# Patient Record
Sex: Female | Born: 1955 | Race: White | Hispanic: No | Marital: Single | Smoking: Never smoker
Health system: Southern US, Community
[De-identification: ages and names within clinical notes are randomized; demographics above are authoritative.]

## PROBLEM LIST (undated history)

## (undated) DIAGNOSIS — K219 Gastro-esophageal reflux disease without esophagitis: Secondary | ICD-10-CM

## (undated) DIAGNOSIS — Z87442 Personal history of urinary calculi: Secondary | ICD-10-CM

## (undated) DIAGNOSIS — M1990 Unspecified osteoarthritis, unspecified site: Secondary | ICD-10-CM

## (undated) DIAGNOSIS — E785 Hyperlipidemia, unspecified: Secondary | ICD-10-CM

## (undated) DIAGNOSIS — T8859XA Other complications of anesthesia, initial encounter: Secondary | ICD-10-CM

## (undated) DIAGNOSIS — F32A Depression, unspecified: Secondary | ICD-10-CM

## (undated) DIAGNOSIS — I639 Cerebral infarction, unspecified: Secondary | ICD-10-CM

## (undated) DIAGNOSIS — I10 Essential (primary) hypertension: Secondary | ICD-10-CM

## (undated) DIAGNOSIS — H269 Unspecified cataract: Secondary | ICD-10-CM

## (undated) HISTORY — PX: DILATION AND CURETTAGE OF UTERUS: SHX78

## (undated) HISTORY — PX: COLONOSCOPY: SHX174

## (undated) HISTORY — DX: Hyperlipidemia, unspecified: E78.5

## (undated) HISTORY — DX: Depression, unspecified: F32.A

## (undated) HISTORY — PX: ESOPHAGOGASTRODUODENOSCOPY: SHX1529

## (undated) HISTORY — PX: APPENDECTOMY: SHX54

## (undated) HISTORY — PX: ABDOMINAL HYSTERECTOMY: SHX81

## (undated) HISTORY — DX: Essential (primary) hypertension: I10

## (undated) HISTORY — DX: Cerebral infarction, unspecified: I63.9

## (undated) HISTORY — PX: JOINT REPLACEMENT: SHX530

## (undated) HISTORY — PX: CHOLECYSTECTOMY: SHX55

## (undated) HISTORY — PX: HIP SURGERY: SHX245

## (undated) HISTORY — DX: Unspecified cataract: H26.9

## (undated) HISTORY — PX: TONSILLECTOMY: SUR1361

## (undated) HISTORY — DX: Gastro-esophageal reflux disease without esophagitis: K21.9

---

## 2014-09-25 DIAGNOSIS — I639 Cerebral infarction, unspecified: Secondary | ICD-10-CM

## 2014-09-25 HISTORY — DX: Cerebral infarction, unspecified: I63.9

## 2021-01-22 ENCOUNTER — Emergency Department
Admission: EM | Admit: 2021-01-22 | Discharge: 2021-01-22 | Disposition: A | Discharge status: Home / Self Care | Payer: Medicare Other | Attending: Emergency Medicine | Admitting: Emergency Medicine

## 2021-01-22 ENCOUNTER — Other Ambulatory Visit: Payer: Self-pay

## 2021-01-22 ENCOUNTER — Emergency Department: Payer: Medicare Other

## 2021-01-22 ENCOUNTER — Encounter: Payer: Self-pay | Admitting: Physician Assistant

## 2021-01-22 DIAGNOSIS — S76012A Strain of muscle, fascia and tendon of left hip, initial encounter: Secondary | ICD-10-CM | POA: Diagnosis not present

## 2021-01-22 DIAGNOSIS — W010XXA Fall on same level from slipping, tripping and stumbling without subsequent striking against object, initial encounter: Secondary | ICD-10-CM | POA: Diagnosis not present

## 2021-01-22 DIAGNOSIS — R202 Paresthesia of skin: Secondary | ICD-10-CM | POA: Diagnosis not present

## 2021-01-22 DIAGNOSIS — S79912A Unspecified injury of left hip, initial encounter: Secondary | ICD-10-CM | POA: Diagnosis present

## 2021-01-22 DIAGNOSIS — W19XXXA Unspecified fall, initial encounter: Secondary | ICD-10-CM

## 2021-01-22 DIAGNOSIS — Z7982 Long term (current) use of aspirin: Secondary | ICD-10-CM | POA: Insufficient documentation

## 2021-01-22 DIAGNOSIS — Z96642 Presence of left artificial hip joint: Secondary | ICD-10-CM | POA: Insufficient documentation

## 2021-01-22 IMAGING — CR DG HIP (WITH OR WITHOUT PELVIS) 2-3V*L*
1 series · 3 of 3 positions shown · non-contrast
Comparison: None.

CLINICAL DATA: Left hip pain after falling 5 days ago.

EXAM:
DG HIP (WITH OR WITHOUT PELVIS) 2-3V LEFT

[Series 1: dg hip unilat w or w/o pelvis 2-3 views  · non-contrast · 0.14mm/px · 3 of 3 slices shown]
[im 1/3]
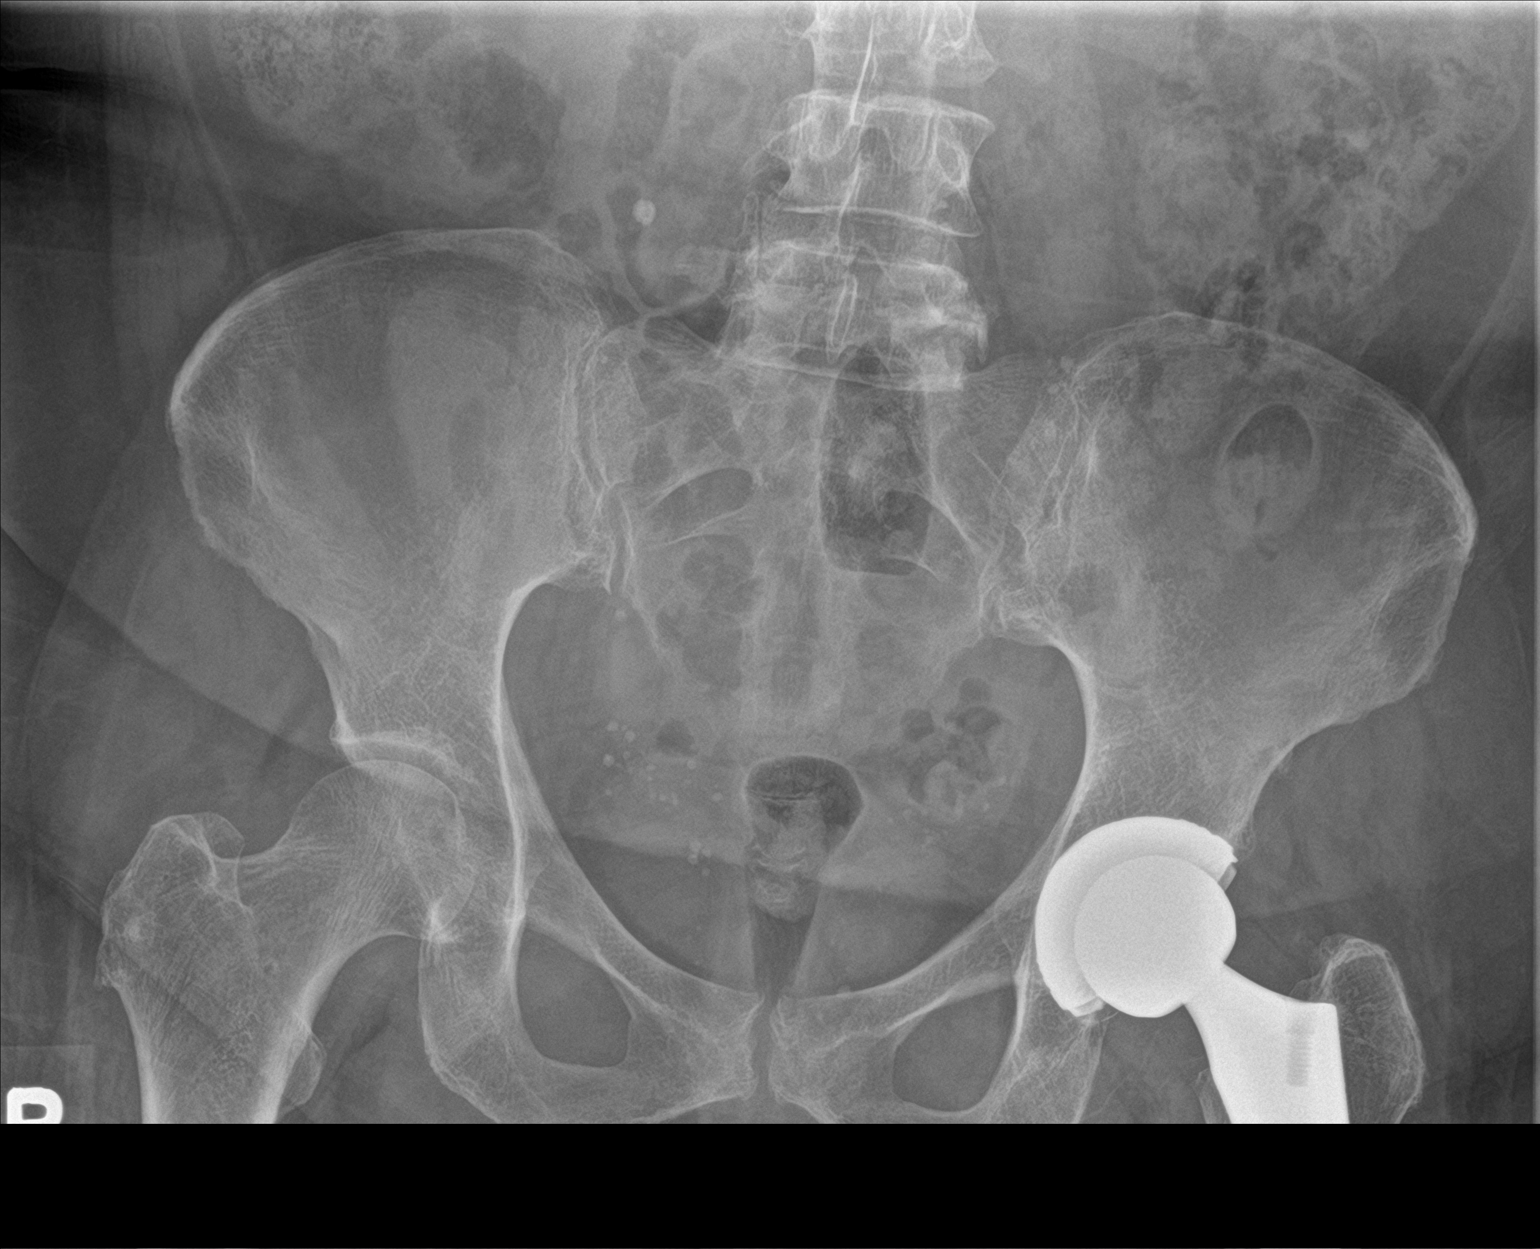
[im 2/3]
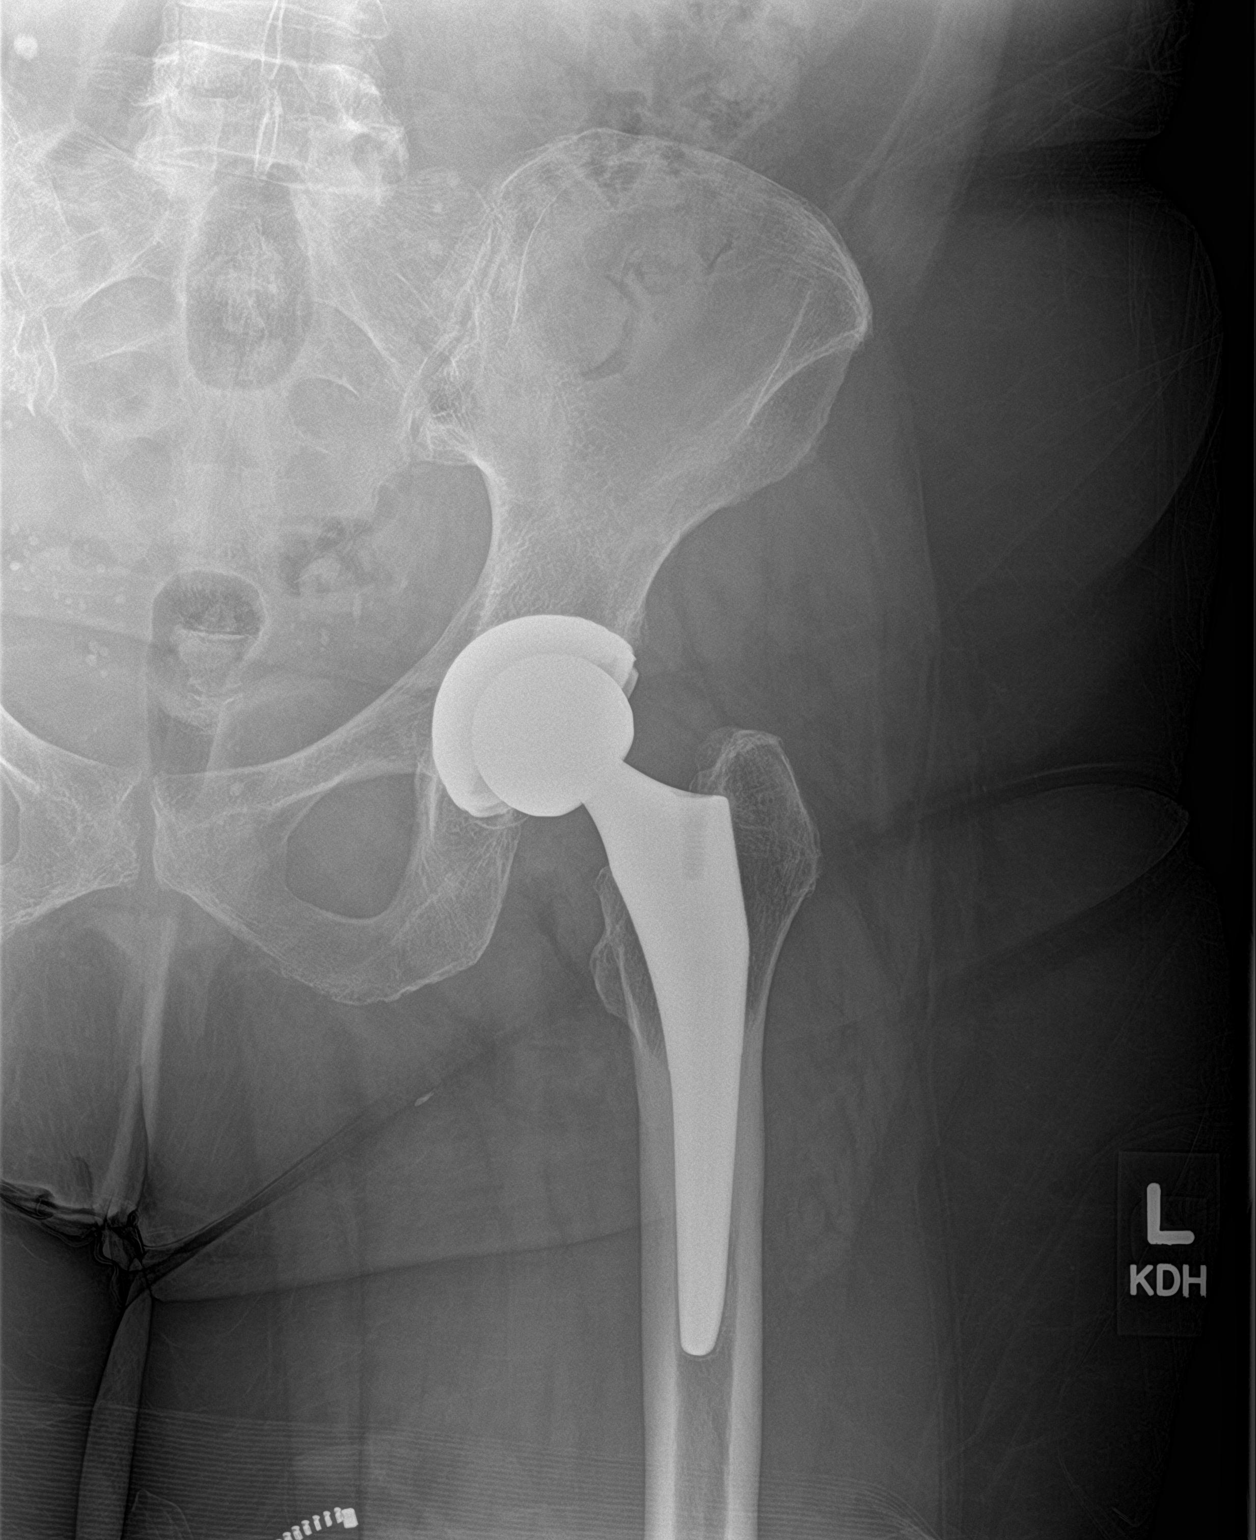
[im 3/3]
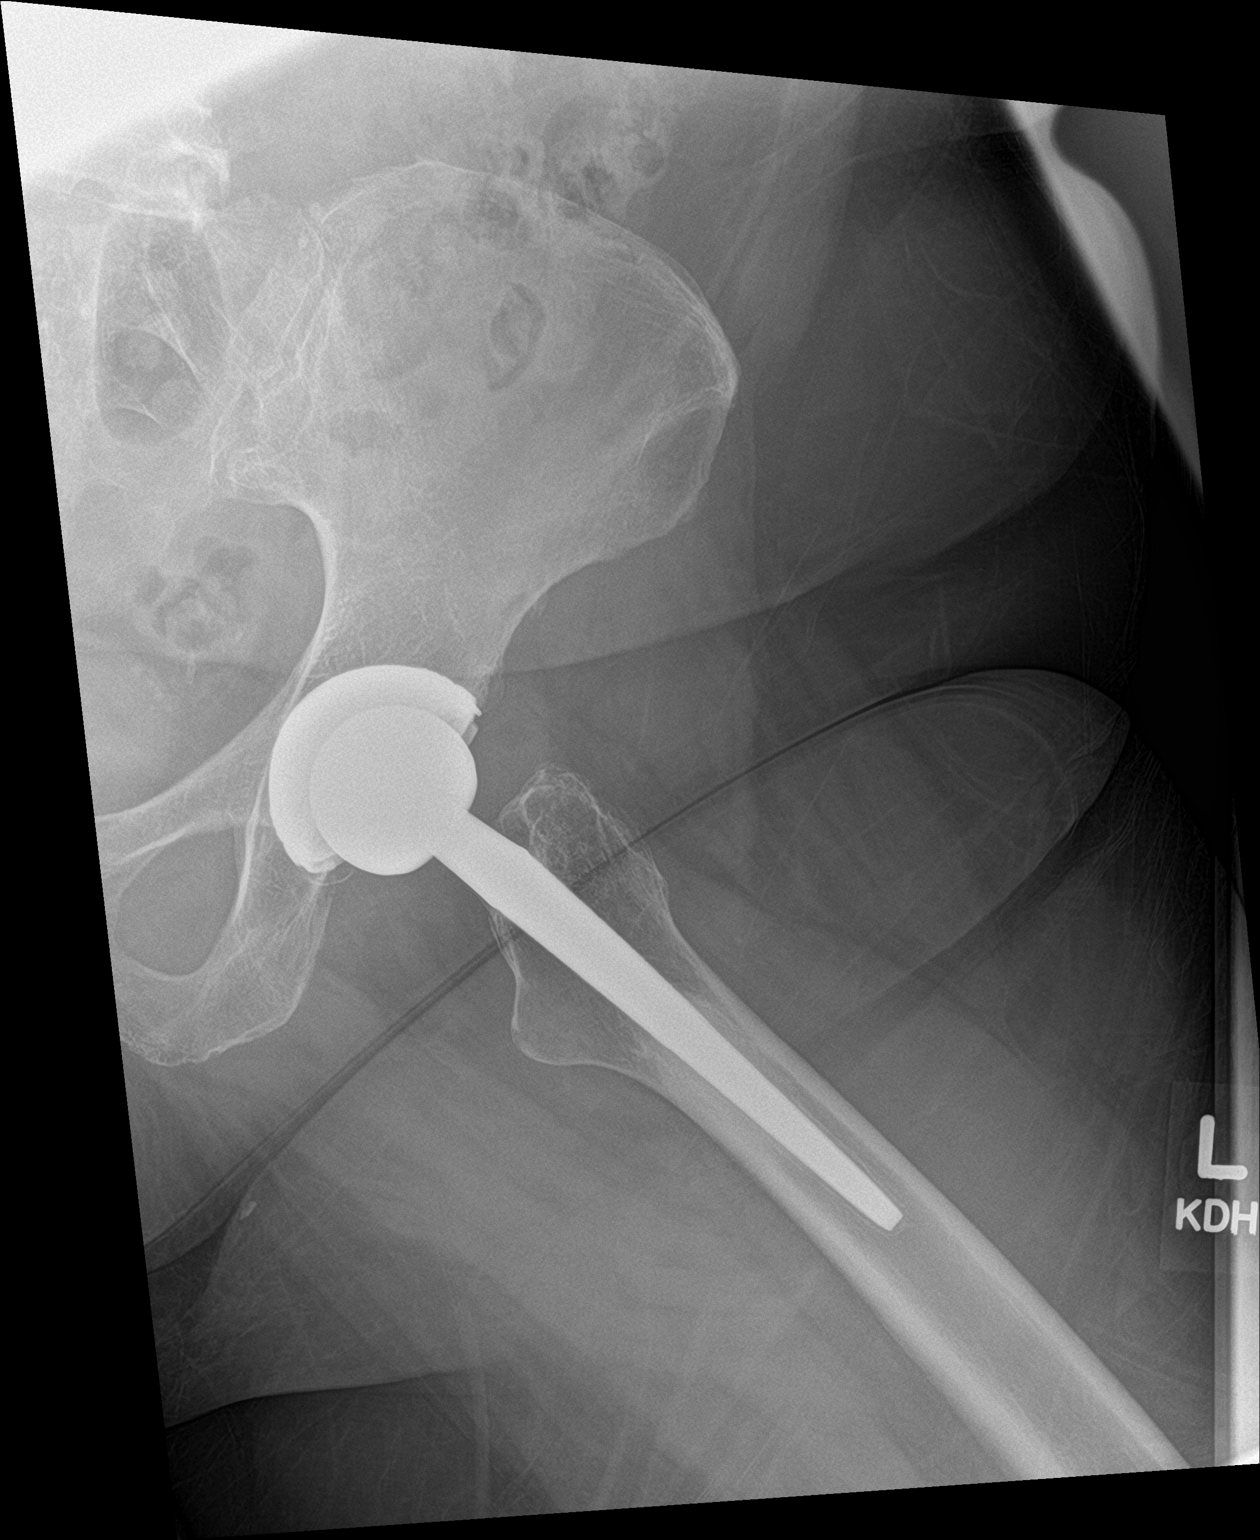

[3 of 3 positions shown; findings below may reference images not displayed]

FINDINGS: Pelvic bony ring is intact. There is a left hip arthroplasty is
located. Negative for a periprosthetic fracture. Multiple small
calcifications in the pelvis. There is a prominent calcification in
the right lower abdomen.
IMPRESSION: No acute bone abnormality to the pelvis or left hip.

## 2021-01-22 MED ORDER — KETOROLAC TROMETHAMINE 10 MG PO TABS: 10.0000 mg | ORAL_TABLET | Freq: Three times a day (TID) | ORAL | 0 refills | Status: DC

## 2021-01-22 MED ORDER — CYCLOBENZAPRINE HCL 5 MG PO TABS: 5.0000 mg | ORAL_TABLET | Freq: Three times a day (TID) | ORAL | 0 refills | Status: DC | PRN

## 2021-01-22 MED ORDER — CYCLOBENZAPRINE HCL 10 MG PO TABS
5.0000 mg | ORAL_TABLET | Freq: Once | ORAL | Status: AC
  Administered 2021-01-22: 5 mg via ORAL
  Filled 2021-01-22: qty 1

## 2021-01-22 MED ORDER — KETOROLAC TROMETHAMINE 10 MG PO TABS
10.0000 mg | ORAL_TABLET | Freq: Once | ORAL | Status: AC
  Administered 2021-01-22: 10 mg via ORAL
  Filled 2021-01-22: qty 1

## 2021-01-22 NOTE — ED Triage Notes (Signed)
Pt to ED POV for left hip pain after fall 5 days ago. States hx of surgery to left hip and has chronic numbness which causes frequent falls. Denies hitting head. States now hard to walk on left hip d/t pain.

## 2021-01-22 NOTE — Discharge Instructions (Addendum)
Your exam and XR are normal and reassuring following your fall. Take the prescription meds as directed. Follow-up with one of the local providers for continued care. Return as needed.

## 2021-01-23 NOTE — ED Provider Notes (Signed)
[facility] Emergency Department Provider Note ____________________________________________  Time seen: 1535  I have reviewed the triage vital signs and the nursing notes.  HISTORY  Chief Complaint  Hip Pain   HPI Tammy Stephens is a 65 y.o. female below medical history, and a remote history of a left hip replacement, presents for evaluation after a fall.  The patient apparently fell 5 days ago, but has had persistent left-sided hip pain since that time.  She does not report ambulating with a walker or crutch, despite reporting chronic left leg paresthesias and frequent falls.  Patient describes slipping and falling onto her right hip, but experiencing left hip pain and discomfort.  She denies any head injury or LOC.   History reviewed. No pertinent past medical history.  There are no problems to display for this patient.   History reviewed. No pertinent surgical history.  Prior to Admission medications   Medication Sig Start Date End Date Taking? Authorizing Provider  aspirin EC 81 MG tablet Take 81 mg by mouth daily. Swallow whole.   Yes [provider]  atorvastatin (LIPITOR) 20 MG tablet Take 20 mg by mouth daily.   Yes [provider]  b complex vitamins capsule Take 1 capsule by mouth daily.   Yes [provider]  cholecalciferol (VITAMIN D3) 25 MCG (1000 UNIT) tablet Take 1,000 Units by mouth daily.   Yes [provider]  cyclobenzaprine (FLEXERIL) 5 MG tablet Take 1 tablet (5 mg total) by mouth 3 (three) times daily as needed. 01/22/21  Yes Alastor Kneale, Charlesetta Ivory, PA-C  DULoxetine (CYMBALTA) 60 MG capsule Take 60 mg by mouth daily.   Yes [provider]  gabapentin (NEURONTIN) 100 MG capsule Take 100 mg by mouth 3 (three) times daily.   Yes [provider]  ketorolac (TORADOL) 10 MG tablet Take 1 tablet (10 mg total) by mouth every 8 (eight) hours. 01/22/21  Yes Lj Miyamoto, Charlesetta Ivory, PA-C  losartan  (COZAAR) 50 MG tablet Take 50 mg by mouth daily.   Yes [provider]  multivitamin-iron-minerals-folic acid (CENTRUM) chewable tablet Chew 1 tablet by mouth daily.   Yes [provider]  nortriptyline (PAMELOR) 25 MG capsule Take 25 mg by mouth at bedtime.   Yes [provider]  omeprazole (PRILOSEC) 20 MG capsule Take 20 mg by mouth daily.   Yes [provider]    Allergies Patient has no known allergies.  History reviewed. No pertinent family history.  Social History    Review of Systems  Constitutional: Negative for fever. Eyes: Negative for visual changes. ENT: Negative for sore throat. Cardiovascular: Negative for chest pain. Respiratory: Negative for shortness of breath. Gastrointestinal: Negative for abdominal pain, vomiting and diarrhea. Genitourinary: Negative for dysuria. Musculoskeletal: Negative for back pain.  Pain as above. Skin: Negative for rash. Neurological: Negative for headaches, focal weakness or numbness. ____________________________________________  PHYSICAL EXAM:  VITAL SIGNS: ED Triage Vitals  Enc Vitals Group     BP 01/22/21 1329 130/76     Pulse Rate 01/22/21 1329 (!) 110     Resp 01/22/21 1329 18     Temp 01/22/21 1329 99.6 F (37.6 C)     Temp Source 01/22/21 1329 Oral     SpO2 01/22/21 1329 99 %     Weight 01/22/21 1330 200 lb (90.7 kg)     Height 01/22/21 1330 5\' 2"  (1.575 m)     Head Circumference --      Peak Flow --  Pain Score 01/22/21 1330 8     Pain Loc --      Pain Edu? --      Excl. in GC? --     Constitutional: Alert and oriented. Well appearing and in no distress. Head: Normocephalic and atraumatic. Eyes: Conjunctivae are normal.  Neck: Supple.  Normal range of motion. Cardiovascular: Normal rate, regular rhythm. Normal distal pulses. Respiratory: Normal respiratory effort. No wheezes/rales/rhonchi. Gastrointestinal: Soft and nontender. No distention. Musculoskeletal: Left hip  with normal flexion and extension range on exam.  Mildly tender to palpation to the left gluteal and trochanteric region.  Nontender with normal range of motion in all extremities.  Neurologic: Cranial nerves II to XII grossly intact.  Normal LE DTRs bilaterally.  Negative seated straight leg raise.  Normal gait without ataxia. Normal speech and language. No gross focal neurologic deficits are appreciated. Skin:  Skin is warm, dry and intact. No rash noted. Psychiatric: Mood and affect are normal. Patient exhibits appropriate insight and judgment. ____________________________________________   RADIOLOGY  DG Left Hip w/ Pelvis  IMPRESSION: No acute bone abnormality to the pelvis or left hip ____________________________________________  PROCEDURES  Toradol 30 mg IM Flexeril 5 mg PO  Procedures ____________________________________________   INITIAL IMPRESSION / ASSESSMENT AND PLAN / ED COURSE  As part of my medical decision making, I reviewed the following data within the electronic MEDICAL RECORD NUMBER Notes from prior ED visits     Patient ED evaluation of ongoing left hip pain following mechanical fall.  Patient was evaluated for symptoms with an x-ray of the hip and pelvis did not reveal any acute fracture or dislocation.  Clinically she is stable without any acute or muscle deficit or other red flags on exam.  She was discharged after ED administration of muscle relaxant and anti-inflammatory reporting improved symptoms.  She will be discharged with prescriptions for the same, and encouraged to follow-up with primary provider.  Return cautions have been reviewed.  Palin Tristan was evaluated in Emergency Department on 01/23/2021 for the symptoms described in the history of present illness. She was evaluated in the context of the global COVID-19 pandemic, which necessitated consideration that the patient might be at risk for infection with the SARS-CoV-2 virus that causes COVID-19.  Institutional protocols and algorithms that pertain to the evaluation of patients at risk for COVID-19 are in a state of rapid change based on information released by regulatory bodies including the CDC and federal and state organizations. These policies and algorithms were followed during the patient's care in the ED. ____________________________________________  FINAL CLINICAL IMPRESSION(S) / ED DIAGNOSES  Final diagnoses:  Fall, initial encounter  Hip strain, left, initial encounter      Lissa Hoard, PA-C 01/23/21 Joya San, MD 01/27/21 [phone]

## 2021-10-14 ENCOUNTER — Other Ambulatory Visit: Payer: Self-pay | Admitting: Orthopedic Surgery

## 2021-10-14 DIAGNOSIS — M545 Low back pain, unspecified: Secondary | ICD-10-CM

## 2021-10-19 ENCOUNTER — Ambulatory Visit
Admission: RE | Admit: 2021-10-19 | Discharge: 2021-10-19 | Disposition: A | Discharge status: Home / Self Care | Payer: Medicare Other | Source: Ambulatory Visit | Attending: Orthopedic Surgery | Admitting: Orthopedic Surgery

## 2021-10-19 ENCOUNTER — Other Ambulatory Visit: Payer: Self-pay

## 2021-10-19 DIAGNOSIS — M545 Low back pain, unspecified: Secondary | ICD-10-CM | POA: Diagnosis present

## 2021-10-19 IMAGING — MR MR LUMBAR SPINE W/O CM
5 series · 31 of 48 positions shown · non-contrast
Comparison: None.

CLINICAL DATA: Low back pain radiating into right leg and knee

EXAM:
MRI LUMBAR SPINE WITHOUT CONTRAST
TECHNIQUE: Multiplanar, multisequence MR imaging of the lumbar spine was
performed. No intravenous contrast was administered.

[Series 5: T2 · sagittal · 4.0mm · 0.81mm/px · 6 of 17 slices shown (1 of 2)]
[im 1/17]
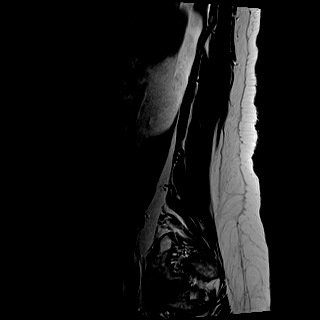
[im 4/17]
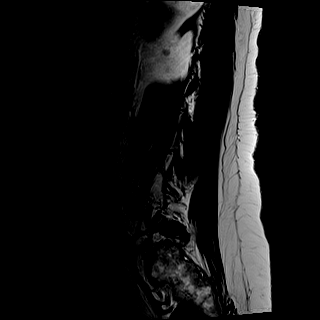
[im 7/17]
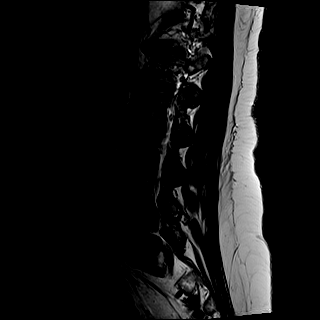
[im 10/17]
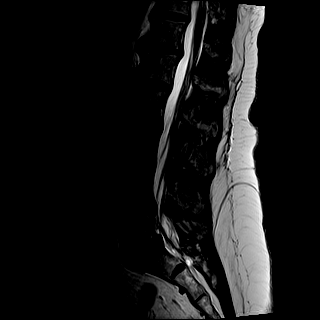
[im 13/17]
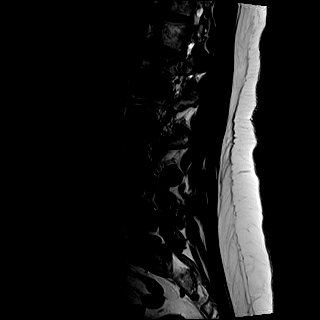
[im 17/17]
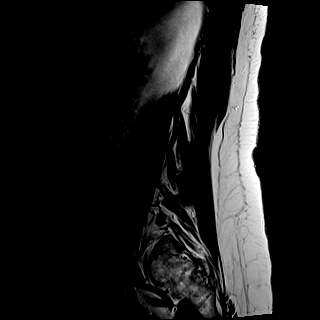

[Series 6: T1 · sagittal · 4.0mm · 0.81mm/px · 7 of 17 slices shown (1 of 2)]
[im 1/17]
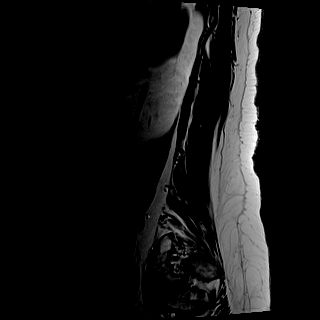
[im 3/17]
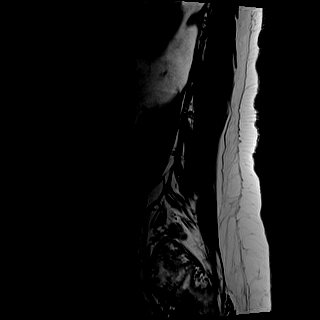
[im 6/17]
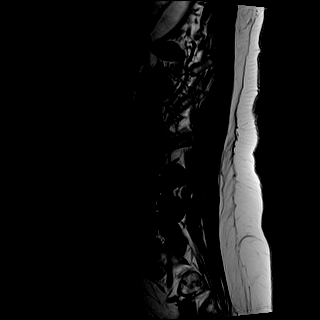
[im 9/17]
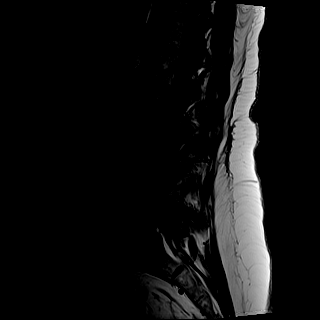
[im 11/17]
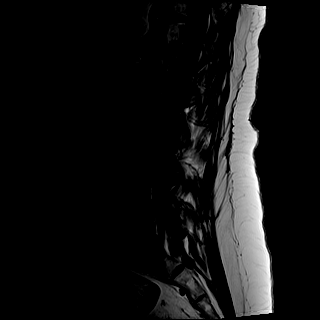
[im 14/17]
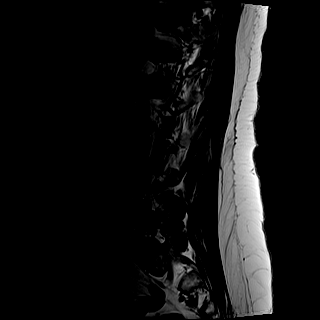
[im 17/17]
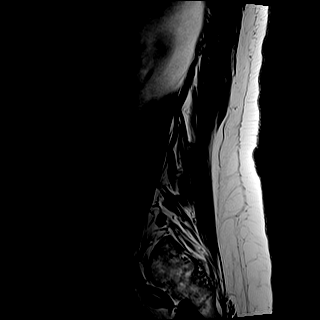

[Series 7: STIR · sagittal · 4.0mm · 0.41mm/px · 2 of 17 slices shown]
[im 1/17]
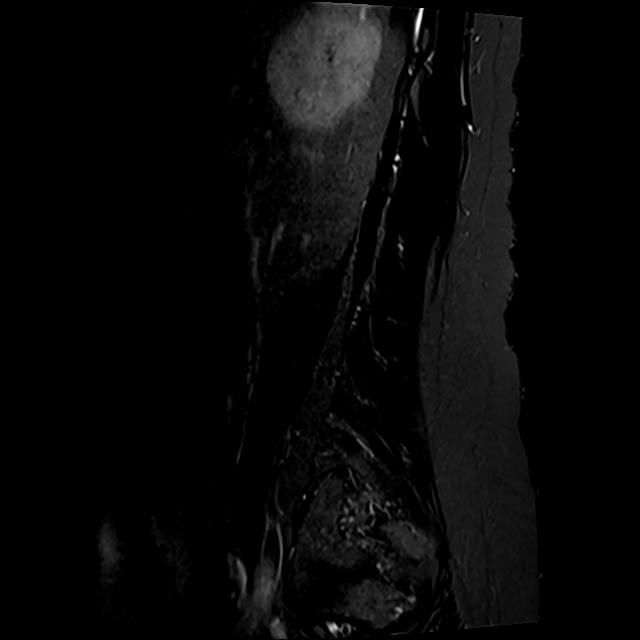
[im 3/17]
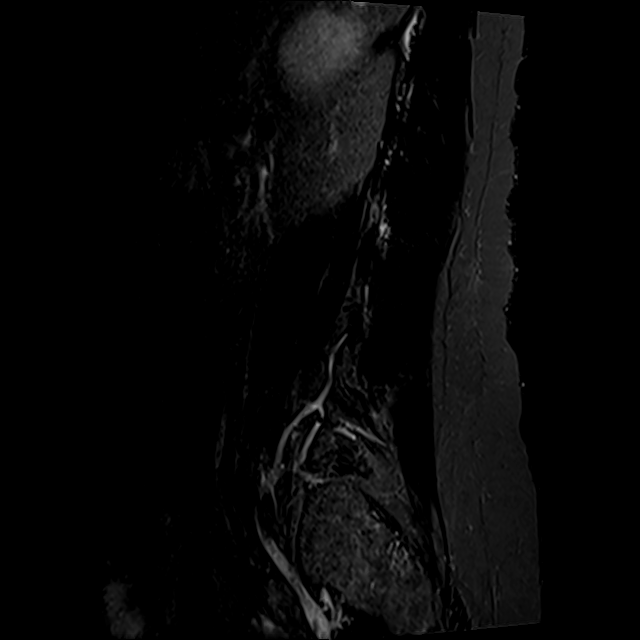

[Series 8: T2 · axial · 4.0mm · 0.78mm/px · z∈[-123,+91]mm · 8 of 36 slices shown (2 of 2)]
[im 1/36]
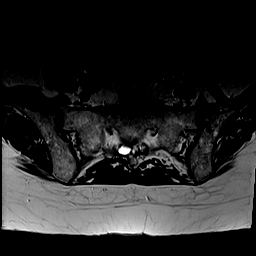
[im 6/36]
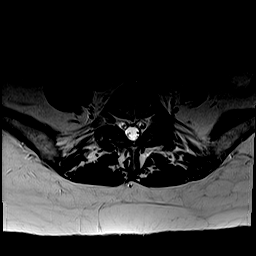
[im 11/36]
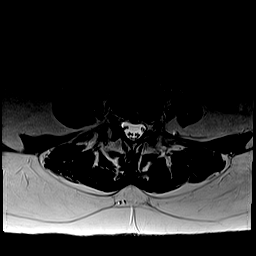
[im 17/36]
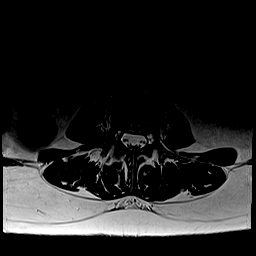
[im 19/36]
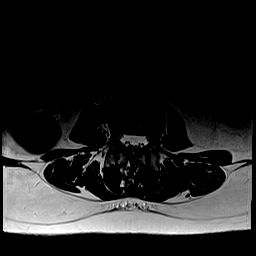
[im 25/36]
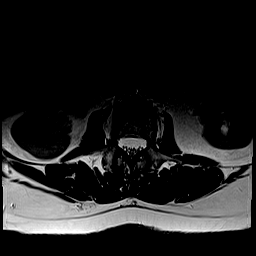
[im 30/36]
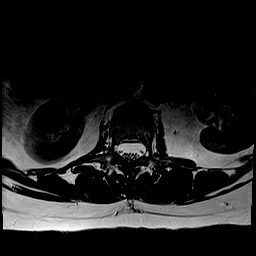
[im 36/36]
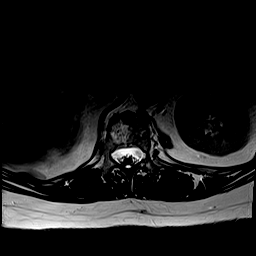

[Series 9: T1 · axial · 4.0mm · 0.39mm/px · z∈[-123,+91]mm · 8 of 36 slices shown (2 of 2)]
[im 1/36]
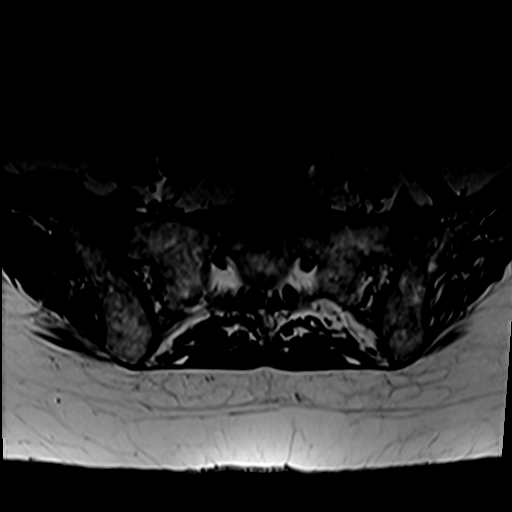
[im 6/36]
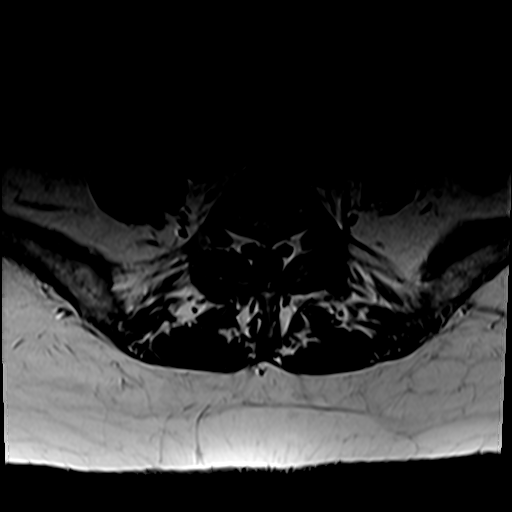
[im 11/36]
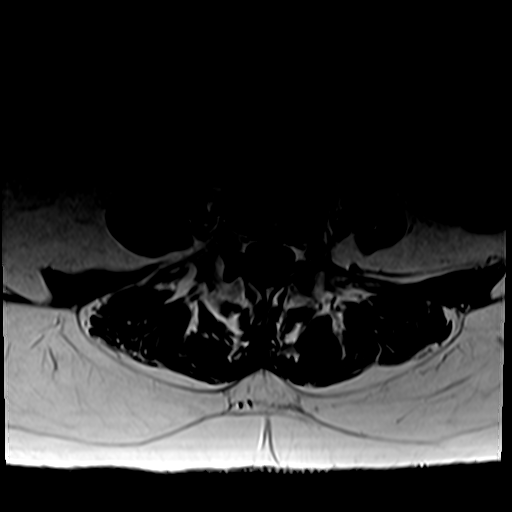
[im 17/36]
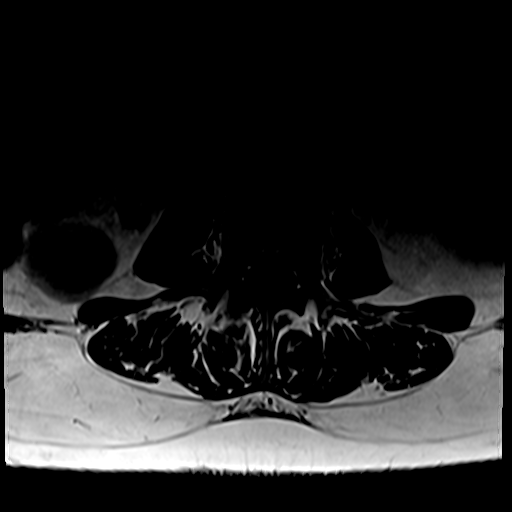
[im 19/36]
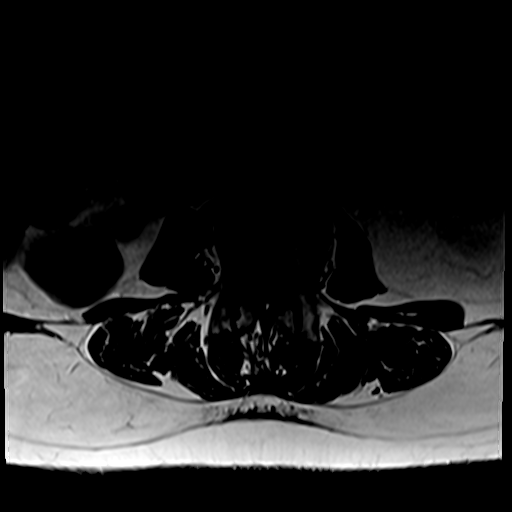
[im 25/36]
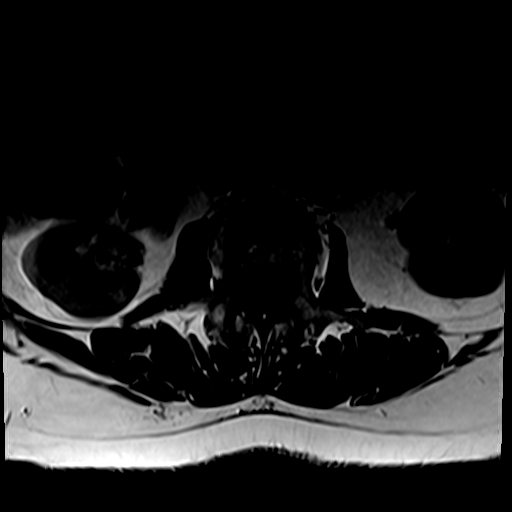
[im 30/36]
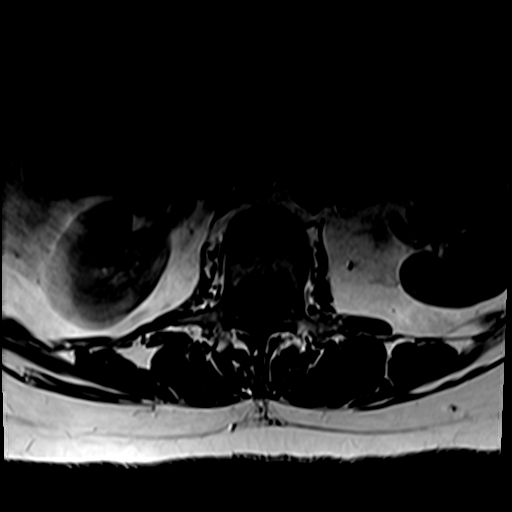
[im 36/36]
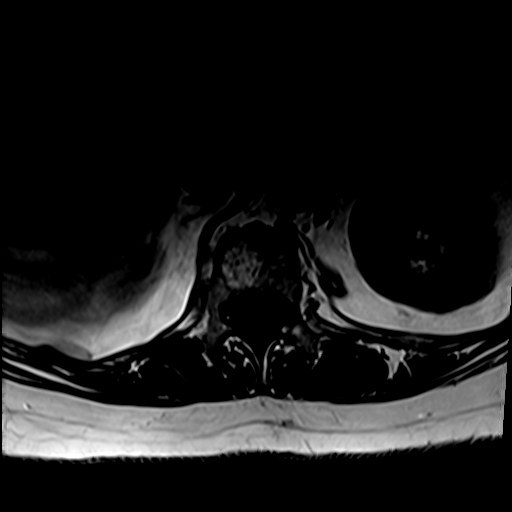

[31 of 48 positions shown; findings below may reference images not displayed]

FINDINGS: Segmentation:  Standard.

Alignment:  Levocurvature.  Otherwise physiologic.

Vertebrae: No acute fracture or suspicious osseous lesion.
Congenitally short pedicles, which narrow the AP diameter of the
spinal canal.

Conus medullaris and cauda equina: Conus extends to the L1 level.
Conus and cauda equina appear normal.

Paraspinal and other soft tissues: Left renal cysts. Otherwise
negative.

Disc levels:

T12-L1: Disc height loss with mild disc bulge with superimposed
right foraminal protrusion and left extreme lateral protrusion. No
spinal canal stenosis or neural foraminal narrowing.

L1-L2: Mild disc bulge. No spinal canal stenosis or neural foraminal
narrowing.

L2-L3: Mild disc bulge. Mild facet arthropathy. No spinal canal
stenosis or neural foraminal narrowing.

L3-L4: Mild disc bulge. Mild facet arthropathy. No spinal canal
stenosis or neural foraminal narrowing.

L4-L5: Moderate disc bulge with central protrusion. Moderate to
severe facet arthropathy. No spinal canal stenosis or neural
foraminal narrowing.

L5-S1: Minimal disc bulge. Severe left and moderate right facet
arthropathy. No spinal canal stenosis or neural foraminal narrowing.
IMPRESSION: 1. No spinal canal stenosis or neural foraminal narrowing.
2. Multilevel facet arthropathy, worst on the left at L5-S1, which
can cause back pain.
3. Levocurvature of the lumbar spine.

## 2021-11-15 ENCOUNTER — Other Ambulatory Visit: Payer: Self-pay | Admitting: Physical Medicine & Rehabilitation

## 2021-11-15 DIAGNOSIS — N39498 Other specified urinary incontinence: Secondary | ICD-10-CM

## 2021-11-20 ENCOUNTER — Other Ambulatory Visit: Payer: Self-pay

## 2021-11-20 ENCOUNTER — Ambulatory Visit
Admission: RE | Admit: 2021-11-20 | Discharge: 2021-11-20 | Disposition: A | Discharge status: Home / Self Care | Payer: Medicare Other | Source: Ambulatory Visit | Attending: Physical Medicine & Rehabilitation | Admitting: Physical Medicine & Rehabilitation

## 2021-11-20 DIAGNOSIS — N39498 Other specified urinary incontinence: Secondary | ICD-10-CM | POA: Diagnosis present

## 2021-11-20 IMAGING — MR MR THORACIC SPINE W/O CM
6 series · 30 of 48 positions shown · non-contrast
Comparison: None.

CLINICAL DATA: Low back with right leg pain, and notes incontinence
since [DATE].

EXAM:
MRI THORACIC SPINE WITHOUT CONTRAST
TECHNIQUE: Multiplanar, multisequence MR imaging of the thoracic spine was
performed. No intravenous contrast was administered.

[Series 18: T1 · sagittal · 6.0mm · 1.41mm/px · 2 of 9 slices shown (1 of 2)]
[im 1/9]
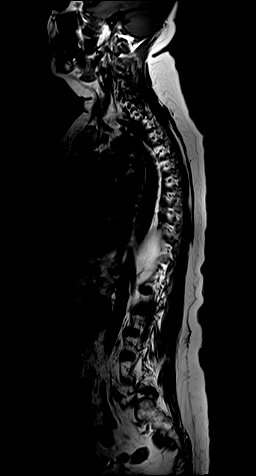
[im 9/9]
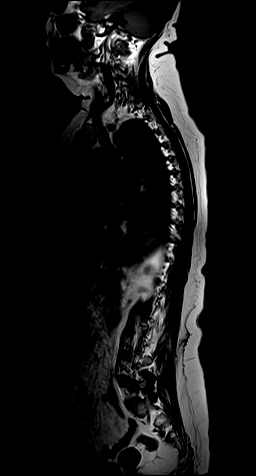

[Series 19: T2 · sagittal · 3.0mm · 1.06mm/px · 6 of 17 slices shown (1 of 2)]
[im 1/17]
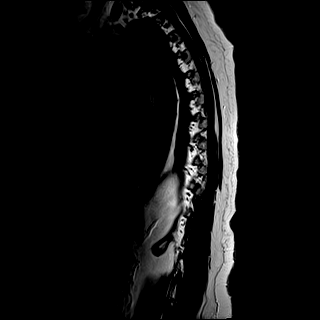
[im 4/17]
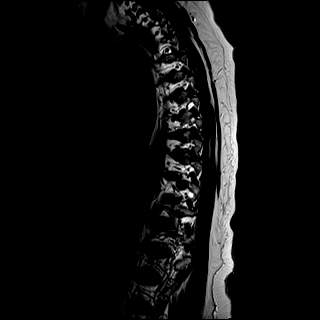
[im 7/17]
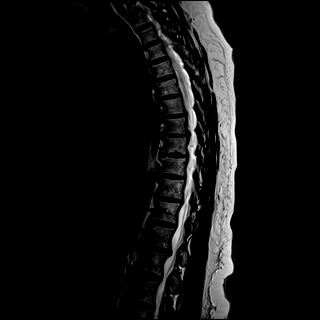
[im 10/17]
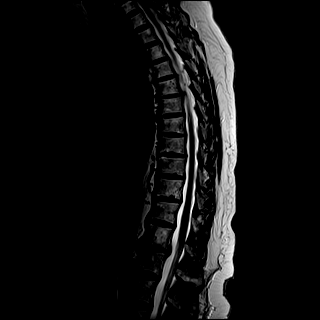
[im 13/17]
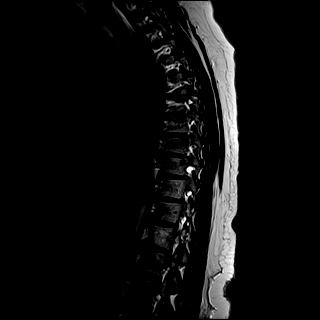
[im 17/17]
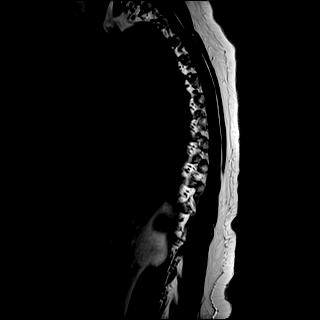

[Series 20: T1 · sagittal · 3.0mm · 1.06mm/px · 6 of 17 slices shown (2 of 2)]
[im 1/17]
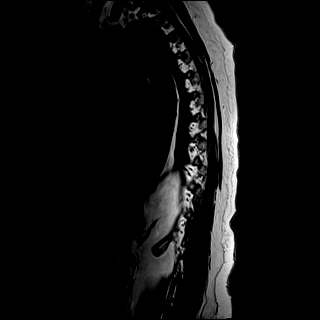
[im 4/17]
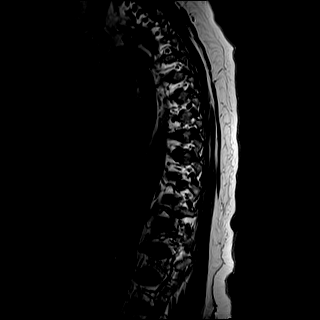
[im 7/17]
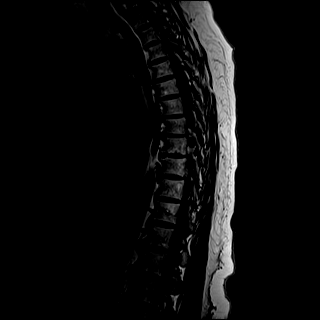
[im 10/17]
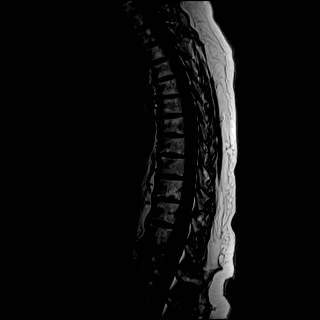
[im 13/17]
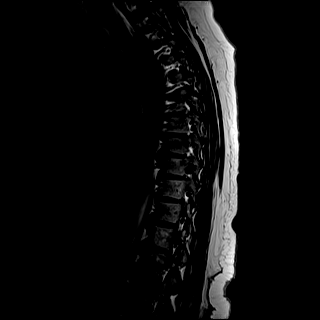
[im 17/17]
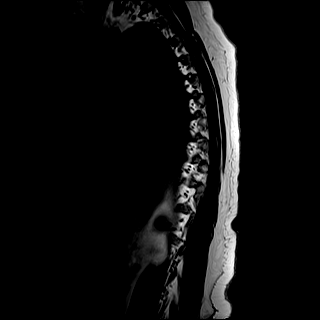

[Series 21: STIR · sagittal · 3.0mm · 0.53mm/px · 6 of 17 slices shown]
[im 1/17]
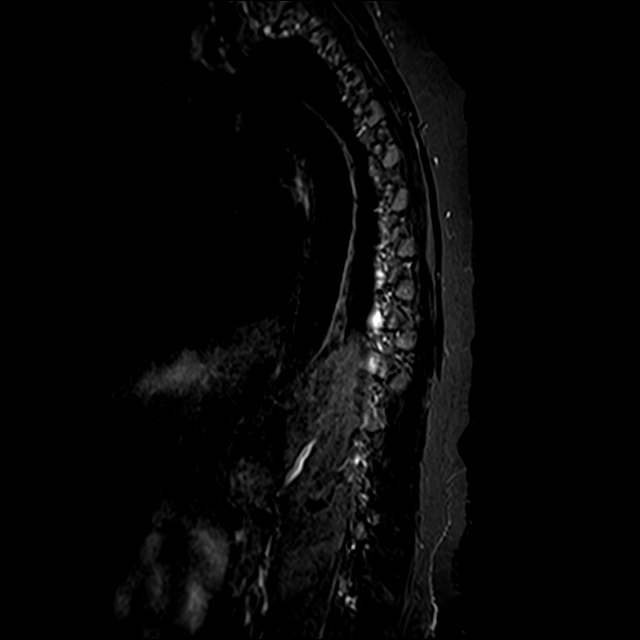
[im 4/17]
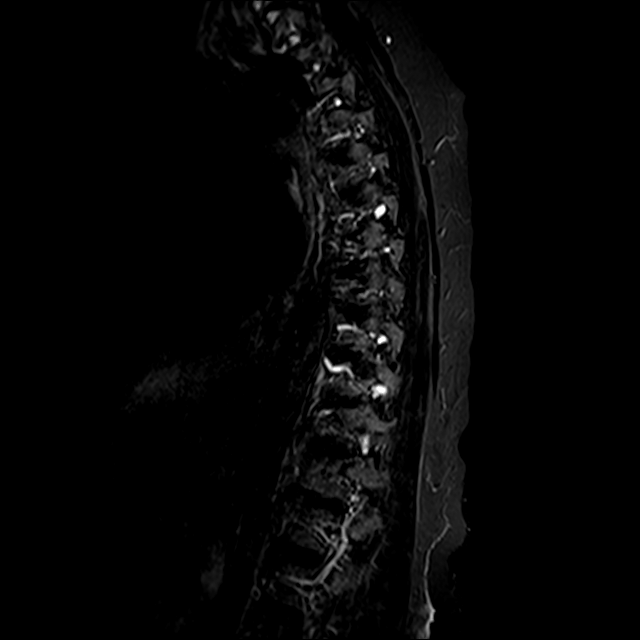
[im 7/17]
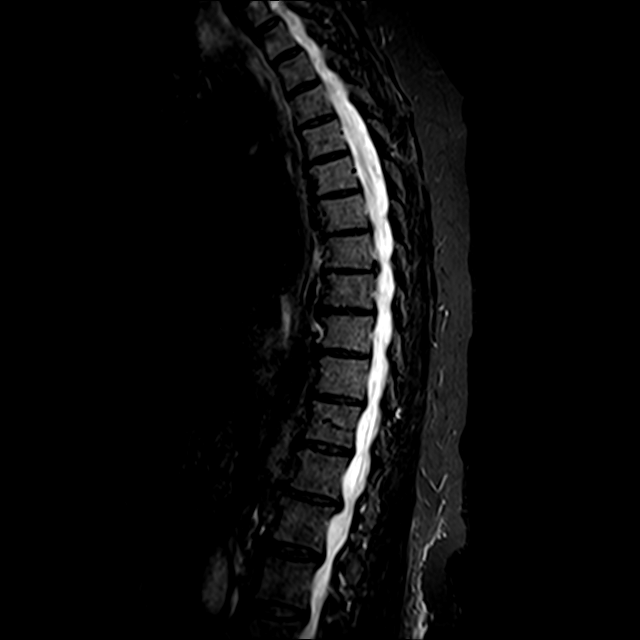
[im 10/17]
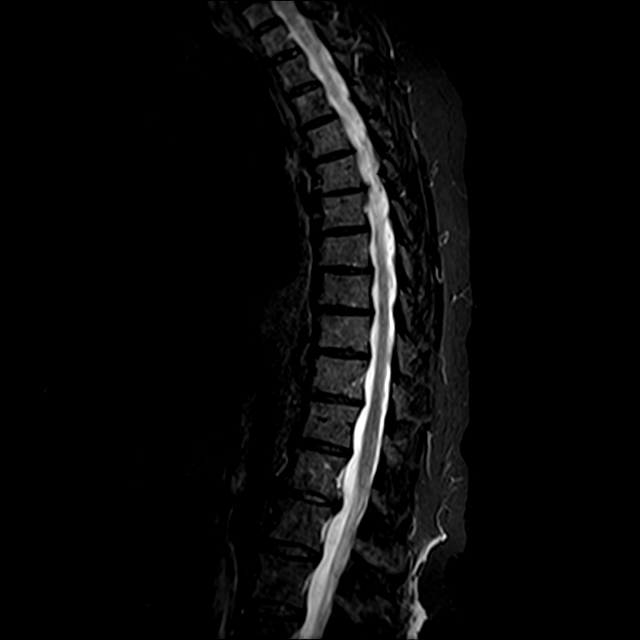
[im 13/17]
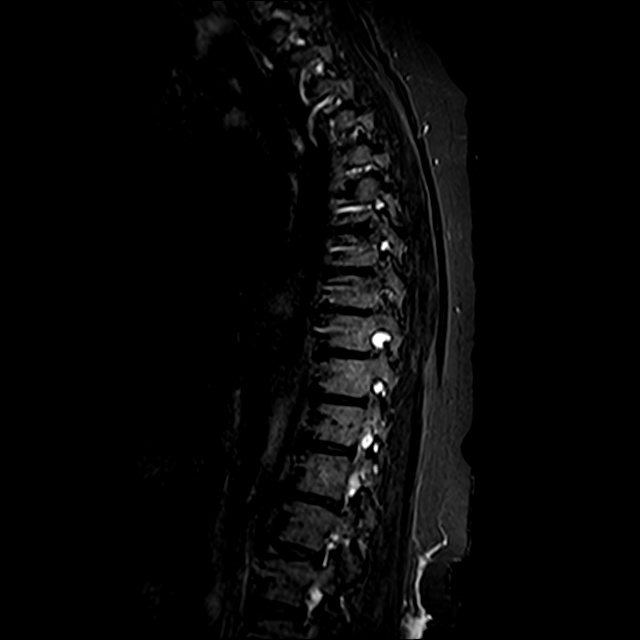
[im 17/17]
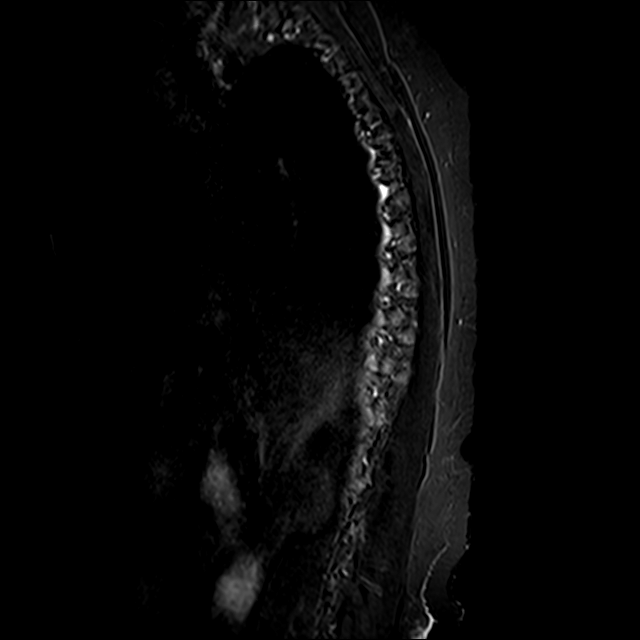

[Series 22: T2 · axial · 4.0mm · 0.74mm/px · z∈[-255,-60]mm · 8 of 39 slices shown (2 of 2)]
[im 1/39]
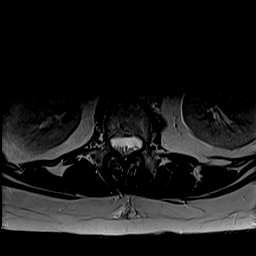
[im 6/39]
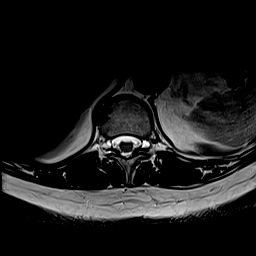
[im 12/39]
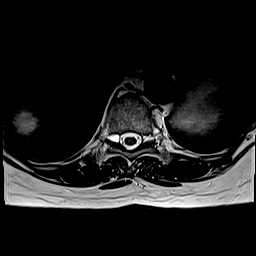
[im 18/39]
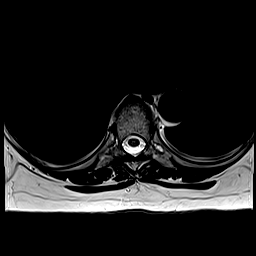
[im 21/39]
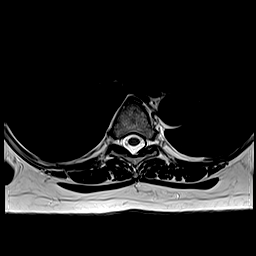
[im 27/39]
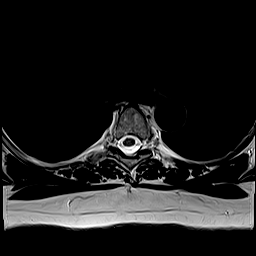
[im 33/39]
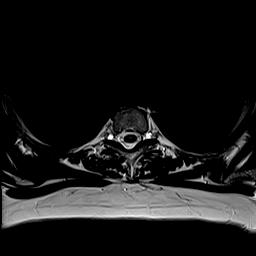
[im 39/39]
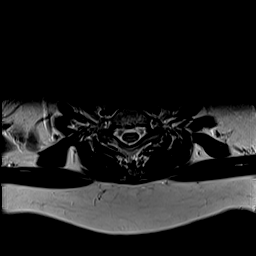

[Series 23: GRE · axial · 4.0mm · 0.37mm/px · z∈[-255,-218]mm · 2 of 39 slices shown]
[im 1/39]
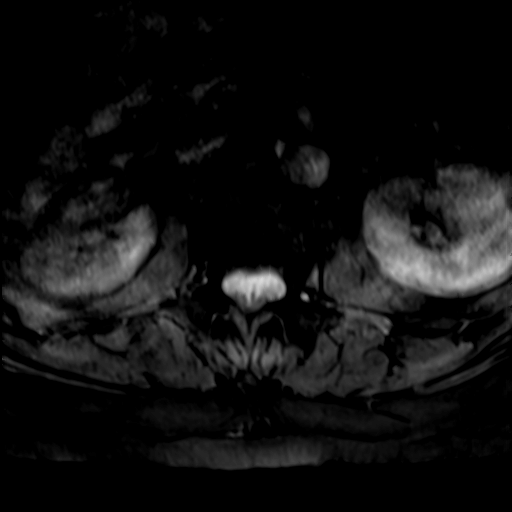
[im 6/39]
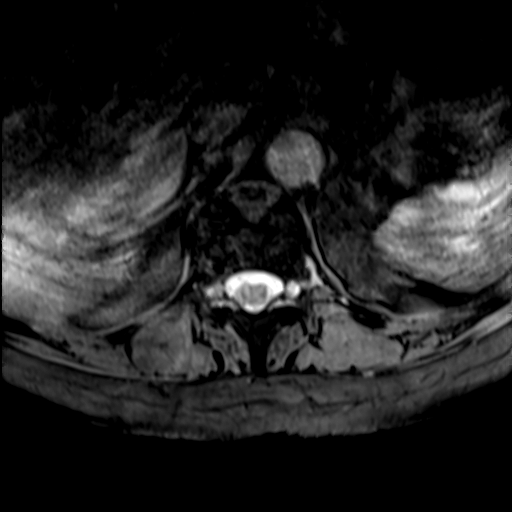

[30 of 48 positions shown; findings below may reference images not displayed]

FINDINGS: Alignment:  Normal.  There is no antero or retrolisthesis.

Vertebrae: Vertebral body heights are preserved. There is no
evidence of acute injury. A focus of T1 and T2 hyperintensity in the
in the T12 vertebral body likely reflects an intraosseous
hemangioma. There is no suspicious marrow signal abnormality.

Cord:  Normal in signal and morphology.

Paraspinal and other soft tissues: A T2 hyperintense lesion in the
right hepatic lobe likely reflects a cyst. The paraspinal soft
tissues are unremarkable.

Disc levels:

There is mild multilevel disc desiccation and narrowing throughout
the thoracic spine. There is multilevel degenerative endplate
change. There are small disc protrusions at T5-T6 through T8-T9, and
T10-T11 through T12-L1, without significant spinal canal or neural
foraminal stenosis. There is no evidence of cord or nerve root
impingement. Scattered perineural cysts are noted.
IMPRESSION: Mild degenerative changes in the thoracic spine as described above
without significant spinal canal or neural foraminal stenosis. No
evidence of cord or nerve root impingement.

## 2022-01-05 ENCOUNTER — Ambulatory Visit
Admission: RE | Admit: 2022-01-05 | Discharge: 2022-01-05 | Disposition: A | Discharge status: Home / Self Care | Payer: Medicare Other | Source: Ambulatory Visit | Attending: Student in an Organized Health Care Education/Training Program | Admitting: Student in an Organized Health Care Education/Training Program

## 2022-01-05 ENCOUNTER — Ambulatory Visit: Payer: Medicare Other | Admitting: Student in an Organized Health Care Education/Training Program

## 2022-01-05 ENCOUNTER — Encounter: Payer: Self-pay | Admitting: Student in an Organized Health Care Education/Training Program

## 2022-01-05 VITALS — BP 130/95 | HR 102 | Temp 97.0°F | Resp 16 | Ht 62.0 in | Wt 201.7 lb

## 2022-01-05 DIAGNOSIS — M533 Sacrococcygeal disorders, not elsewhere classified: Secondary | ICD-10-CM | POA: Insufficient documentation

## 2022-01-05 DIAGNOSIS — M792 Neuralgia and neuritis, unspecified: Secondary | ICD-10-CM | POA: Diagnosis present

## 2022-01-05 DIAGNOSIS — M5116 Intervertebral disc disorders with radiculopathy, lumbar region: Secondary | ICD-10-CM

## 2022-01-05 DIAGNOSIS — G5701 Lesion of sciatic nerve, right lower limb: Secondary | ICD-10-CM | POA: Insufficient documentation

## 2022-01-05 DIAGNOSIS — G894 Chronic pain syndrome: Secondary | ICD-10-CM

## 2022-01-05 DIAGNOSIS — M79661 Pain in right lower leg: Secondary | ICD-10-CM | POA: Insufficient documentation

## 2022-01-05 DIAGNOSIS — G8929 Other chronic pain: Secondary | ICD-10-CM

## 2022-01-05 NOTE — Progress Notes (Signed)
Safety precautions to be maintained throughout the outpatient stay will include: orient to surroundings, keep bed in low position, maintain call bell within reach at all times, provide assistance with transfer out of bed and ambulation.  

## 2022-01-05 NOTE — Progress Notes (Signed)
Patient: Tammy Stephens  Service Category: E/M  Provider: Edward Jolly, MD  ?DOB: [birth year]  DOS: 01/05/2022  Referring Provider: Venetia Night, MD  ?MRN: [id number]  Setting: Ambulatory outpatient  PCP: Default, Provider, MD  ?Type: New Patient  Specialty: Interventional Pain Management    ?Location: Office  Delivery: Face-to-face    ? ?Primary Reason(s) for Visit: Encounter for initial evaluation of one or more chronic problems (new to examiner) potentially causing chronic pain, and posing a threat to normal musculoskeletal function. (Level of risk: High) ?CC: Hip Pain (RIGHT) ? ?HPI  ?Tammy Stephens is a 66 y.o. year old, female patient, who comes for the first time to our practice referred by Venetia Night, MD for our initial evaluation of her chronic pain. She has Neuropathic pain of lower extremity, right; Pain in right lower leg; Lumbar disc herniation with radiculopathy; Chronic pain syndrome; Chronic right SI joint pain; and Piriformis syndrome, right on their problem list. Today she comes in for evaluation of her Hip Pain (RIGHT) ? ?Pain Assessment: ?Location: Right Hip ?Radiating: down right leg to bottom of foot (numbness of foot) ?Onset: More than a month ago ?Duration: Chronic pain ?Quality: Aching, Burning, Dull, Sharp, Shooting, Stabbing, Throbbing, Tingling, Numbness ?Severity: 5 /10 (subjective, self-reported pain score)  ?Effect on ADL: unable to stand for long periods of time, ?Timing: Constant ?Modifying factors: sitting, PT, meditation, stretching ?BP: (!) 130/95  HR: (!) 102 ? ?Onset and Duration: Gradual ?Cause of pain: Unknown ?Severity: Getting worse, NAS-11 at its worse: 8/10, NAS-11 at its best: 3/10, NAS-11 now: 4/10, and NAS-11 on the average: 7/10 ?Timing: During activity or exercise ?Aggravating Factors: Climbing, Kneeling, Lifiting, Motion, Prolonged standing, Squatting, Stooping , Walking, Walking uphill, Walking downhill, and Working ?Alleviating Factors: Bending,  Stretching, Cold packs, Lying down, Resting, Sitting, Sleeping, Warm showers or baths, and PT ?Associated Problems: Dizziness, Fatigue, Numbness, Spasms, Tingling, Vomiting , and Weakness ?Quality of Pain: Aching, Burning, Deep, Disabling, Distressing, Dull, Getting longer, Pulsating, Sharp, Shooting, Sickening, Stabbing, Throbbing, Tingling, and Uncomfortable ?Previous Examinations or Tests: MRI scan, Nerve block, Orthopedic evaluation, and Psychiatric evaluation ?Previous Treatments: Epidural steroid injections, Physical Therapy, and Stretching exercises ? ?Tammy Stephens is a pleasant 66 year old female who presents with a chief complaint of axial low back pain that radiates into her right thigh and right leg with associated paresthesias below her right knee.  She also has some intermittent weakness in her right leg.  The symptoms started in January.  No inciting or traumatic event.  She has been participating in physical therapy which has helped a small amount.  She has tried various medication trials including Aleve Advil Cymbalta gabapentin prednisone with limited response.  She has had prior right L5-S1 transforaminal epidural steroid injection performed on 10/31/2021 and 11/17/2021 with very limited response.  She has been evaluated by Dr. Marcell Barlow with neurosurgery and is being referred here for consideration of spinal cord stimulator trial for lower extremity neuropathic pain.  Of note patient does have a history of total right left hip arthroplasty.  She is currently on Cymbalta, gabapentin, nortriptyline.  She is also on alprazolam for anxiety. ? ?Of note she has completed her thoracic and lumbar MRI below which does not show anything concerning to preclude spinal cord stimulator trial.  She has also completed her psych eval with Peggye Fothergill and has been cleared from a psych standpoint. ? ?Conservative measures:  ?Physical therapy: currently participating in at [facility], has noticed some relief.   ?Multimodal medical therapy including regular  antiinflammatories: aleve, advil, duloxetine, gabapentin, prednisone ?Injections: has had epidural steroid injections ?11/17/21: Right L5-S1 TF ESI (no relief) ?10/31/21: Right L5-S1 TF ESI (10% relief) ? ?Meds  ? ?Current Outpatient Medications:  ?  amLODipine (NORVASC) 5 MG tablet, Take 5 mg by mouth daily., Disp: , Rfl:  ?  aspirin EC 81 MG tablet, Take 81 mg by mouth daily. Swallow whole., Disp: , Rfl:  ?  atorvastatin (LIPITOR) 20 MG tablet, Take 20 mg by mouth daily., Disp: , Rfl:  ?  DULoxetine (CYMBALTA) 60 MG capsule, Take 60 mg by mouth daily., Disp: , Rfl:  ?  gabapentin (NEURONTIN) 100 MG capsule, Take 300 mg by mouth 3 (three) times daily., Disp: , Rfl:  ?  losartan (COZAAR) 50 MG tablet, Take 50 mg by mouth daily., Disp: , Rfl:  ?  multivitamin-iron-minerals-folic acid (CENTRUM) chewable tablet, Chew 1 tablet by mouth daily., Disp: , Rfl:  ?  omeprazole (PRILOSEC) 20 MG capsule, Take 20 mg by mouth daily., Disp: , Rfl:  ?  b complex vitamins capsule, Take 1 capsule by mouth daily. (Patient not taking: Reported on 01/05/2022), Disp: , Rfl:  ?  cholecalciferol (VITAMIN D3) 25 MCG (1000 UNIT) tablet, Take 1,000 Units by mouth daily. (Patient not taking: Reported on 01/05/2022), Disp: , Rfl:  ?  cyclobenzaprine (FLEXERIL) 5 MG tablet, Take 1 tablet (5 mg total) by mouth 3 (three) times daily as needed. (Patient not taking: Reported on 01/05/2022), Disp: 15 tablet, Rfl: 0 ?  ketorolac (TORADOL) 10 MG tablet, Take 1 tablet (10 mg total) by mouth every 8 (eight) hours. (Patient not taking: Reported on 01/05/2022), Disp: 15 tablet, Rfl: 0 ?  nortriptyline (PAMELOR) 25 MG capsule, Take 25 mg by mouth at bedtime. (Patient not taking: Reported on 01/05/2022), Disp: , Rfl:  ? ?Imaging Review  ? ?Thoracic Imaging: ?Thoracic MR wo contrast: Results for orders placed during the hospital encounter of 11/20/21 ? ?MR THORACIC SPINE WO CONTRAST ? ?Narrative ?CLINICAL DATA:  Low back  with right leg pain, and notes incontinence ?since December 2022. ? ?EXAM: ?MRI THORACIC SPINE WITHOUT CONTRAST ? ?TECHNIQUE: ?Multiplanar, multisequence MR imaging of the thoracic spine was ?performed. No intravenous contrast was administered. ? ?COMPARISON:  None. ? ?FINDINGS: ?Alignment:  Normal.  There is no antero or retrolisthesis. ? ?Vertebrae: Vertebral body heights are preserved. There is no ?evidence of acute injury. 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 There is no suspicious marrow signal abnormality. ? ?Cord:  Normal in signal and morphology. ? ?Paraspinal and other soft tissues: A T2 hyperintense lesion in the ?right hepatic lobe likely reflects a cyst. The paraspinal soft ?tissues are unremarkable. ? ?Disc levels: ? ?There is mild multilevel disc desiccation and narrowing throughout ?the thoracic spine. There is multilevel degenerative endplate ?change. There are small disc protrusions at T5-T6 through T8-T9, and ?T10-T11 through T12-L1, without significant spinal canal or neural ?foraminal stenosis. There is no evidence of cord or nerve root ?impingement. Scattered perineural cysts are noted. ? 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 No ?evidence of cord or nerve root impingement. ? ? ?Electronically Signed ?By: Lesia HausenPeter  Noone M.D. ?On: 11/21/2021 12:55 ? ?MR LUMBAR SPINE WO CONTRAST ? ?Narrative ?CLINICAL DATA:  Low back pain radiating into right leg and knee ? ?EXAM: ?MRI LUMBAR SPINE WITHOUT CONTRAST ? ?TECHNIQUE: ?Multiplanar, multisequence MR imaging of the  lumbar spine was ?performed. No intravenous contrast was administered. ? ?COMPARISON:  None. ? ?FINDINGS: ?Segmentation:  Standard. ? ?Alignment:  Levocurvature.  Otherwise physiologic. ? ?Vertebrae: No acute fracture or suspicious osseous lesion. ?Congenitally  short pedicles, which narrow the AP diameter of the ?spinal canal. ? ?Conus medullaris and cauda equina: Conus extends to the L1 level. ?Conus and cauda equina appear normal. ? ?Paraspinal and other soft tissues: Left rena

## 2022-01-05 NOTE — Patient Instructions (Signed)
Sacroiliac (SI) Joint Injection ?Patient Information ? ?Description: The sacroiliac joint connects the scrum (very low back and tailbone) to the ilium (a pelvic bone which also forms half of the hip joint).  Normally this joint experiences very little motion.  When this joint becomes inflamed or unstable low back and or hip and pelvis pain may result.  Injection of this joint with local anesthetics (numbing medicines) and steroids can provide diagnostic information and reduce pain.  This injection is performed with the aid of x-ray guidance into the tailbone area while you are lying on your stomach. ?  You may experience an electrical sensation down the leg while this is being done.  You may also experience numbness.  We also may ask if we are reproducing your normal pain during the injection. ? ?Conditions which may be treated SI injection: ? ?Low back, buttock, hip or leg pain ? ?Preparation for the Injection: ? ?Do not eat any solid food or dairy products within 8 hours of your appointment.  ?You may drink clear liquids up to 3 hours before appointment.  Clear liquids include water, black coffee, juice or soda.  No milk or cream please. ?You may take your regular medications, including pain medications with a sip of water before your appointment.  Diabetics should hold regular insulin (if take separately) and take 1/2 normal NPH dose the morning of the procedure.  Carry some sugar containing items with you to your appointment. ?A driver must accompany you and be prepared to drive you home after your procedure. ?Bring all of your current medications with you. ?An IV may be inserted and sedation may be given at the discretion of the physician. ?A blood pressure cuff, EKG and other monitors will often be applied during the procedure.  Some patients may need to have extra oxygen administered for a short period.  ?You will be asked to provide medical information, including your allergies, prior to the procedure.  We  must know immediately if you are taking blood thinners (like Coumadin/Warfarin) or if you are allergic to IV iodine contrast (dye).  We must know if you could possible be pregnant. ? ?Possible side effects: ? ?Bleeding from needle site ?Infection (rare, may require surgery) ?Nerve injury (rare) ?Numbness & tingling (temporary) ?A brief convulsion or seizure ?Light-headedness (temporary) ?Pain at injection site (several days) ?Decreased blood pressure (temporary) ?Weakness in the leg (temporary) ? ? ?Call if you experience: ? ?New onset weakness or numbness of an extremity below the injection site that last more than 8 hours. ?Hives or difficulty breathing ( go to the emergency room) ?Inflammation or drainage at the injection site ?Any new symptoms which are concerning to you ? ?Please note: ? ?Although the local anesthetic injected can often make your back/ hip/ buttock/ leg feel good for several hours after the injections, the pain will likely return.  It takes 3-7 days for steroids to work in the sacroiliac area.  You may not notice any pain relief for at least that one week. ? ?If effective, we will often do a series of three injections spaced 3-6 weeks apart to maximally decrease your pain.  After the initial series, we generally will wait some months before a repeat injection of the same type. ? ?If you have any questions, please call [phone] ?[facility] ? ?Trigger Point Injection ?Trigger points are areas where you have pain. A trigger point injection is a shot given in the trigger point to help  relieve pain for a few days to a few months. Common places for trigger points include the neck, shoulders, upper back, or lower back. ?A trigger point injection will not cure long-term (chronic) pain permanently. These injections do not always work for every person. For some people, they can help to relieve pain for a few days to a few months. ?Tell a health care provider  about: ?Any allergies you have. ?All medicines you are taking, including vitamins, herbs, eye drops, creams, and over-the-counter medicines. ?Any problems you or family members have had with anesthetic medicines. ?Any bleeding problems you have. ?Any surgeries you have had. ?Any medical conditions you have. ?Whether you are pregnant or may be pregnant. ?What are the risks? ?Generally, this is a safe procedure. However, problems may occur, including: ?Infection. ?Bleeding or bruising. ?Allergic reaction to the injected medicine. ?Irritation of the skin around the injection site. ?What happens before the procedure? ?Ask your health care provider about: ?Changing or stopping your regular medicines. This is especially important if you are taking diabetes medicines or blood thinners. ?Taking medicines such as aspirin and ibuprofen. These medicines can thin your blood. Do not take these medicines unless your health care provider tells you to take them. ?Taking over-the-counter medicines, vitamins, herbs, and supplements. ?What happens during the procedure? ? ?Your health care provider will feel for trigger points. A marker may be used to circle the area for the injection. ?The skin over the trigger point will be washed with a germ-killing soap. ?You may be given a medicine to help you relax (sedative). ?A thin needle is used for the injection. You may feel pain or a twitching feeling when the needle enters your skin. ?A numbing solution may be injected into the trigger point. Sometimes a medicine to keep down inflammation is also injected. ?Your health care provider may move the needle around the area where the trigger point is located until the tightness and twitching goes away. ?After the injection, your health care provider may put gentle pressure over the injection site. ?The injection site will be covered with a bandage (dressing). ?The procedure may vary among health care providers and hospitals. ?What can I expect  after treatment? ?After treatment, you may have soreness and stiffness for 1-2 days. ?Follow these instructions at home: ?Injection site care ?Remove your dressing in a few hours, or as told by your health care provider. ?Check your injection site every day for signs of infection. Check for: ?Redness, swelling, or pain. ?Fluid or blood. ?Warmth. ?Pus or a bad smell. ?Managing pain, stiffness, and swelling ?If directed, put ice on the affected area. To do this: ?Put ice in a plastic bag. ?Place a towel between your skin and the bag. ?Leave the ice on for 20 minutes, 2-3 times a day. ?Remove the ice if your skin turns bright red. This is very important. If you cannot feel pain, heat, or cold, you have a greater risk of damage to the area. ?Activity ?If you were given a sedative during the procedure, it can affect you for several hours. Do not drive or operate machinery until your health care provider says that it is safe. ?Do not take baths, swim, or use a hot tub until your health care provider approves. ?Return to your normal activities as told by your health care provider. Ask your health care provider what activities are safe for you. ?General instructions ?If you were asked to stop your regular medicines, ask your health care provider when you may  start taking them again. ?You may be asked to see an occupational or physical therapist for exercises that reduce muscle strain and stretch the area of the trigger point. ?Keep all follow-up visits. This is important. ?Contact a health care provider if: ?Your pain comes back, and it is worse than before the injection. You may need more injections. ?You have chills or a fever. ?The injection site becomes more painful, red, swollen, or warm to the touch. ?Summary ?A trigger point injection is a shot given in the trigger point to help relieve pain. ?Common places for trigger point injections are the neck, shoulders, upper back, and lower back. ?These injections do not  always work for every person, but for some people, the injections can help to relieve pain for a few days to a few months. ?Contact a health care provider if symptoms come back or if they are worse than before t

## 2022-01-09 ENCOUNTER — Ambulatory Visit (HOSPITAL_BASED_OUTPATIENT_CLINIC_OR_DEPARTMENT_OTHER): Payer: Medicare Other | Admitting: Student in an Organized Health Care Education/Training Program

## 2022-01-09 ENCOUNTER — Encounter: Payer: Self-pay | Admitting: Student in an Organized Health Care Education/Training Program

## 2022-01-09 ENCOUNTER — Ambulatory Visit
Admission: RE | Admit: 2022-01-09 | Discharge: 2022-01-09 | Disposition: A | Discharge status: Home / Self Care | Payer: Medicare Other | Source: Ambulatory Visit | Attending: Student in an Organized Health Care Education/Training Program | Admitting: Student in an Organized Health Care Education/Training Program

## 2022-01-09 VITALS — BP 134/87 | HR 96 | Temp 97.3°F | Resp 16 | Ht 62.0 in | Wt 201.0 lb

## 2022-01-09 DIAGNOSIS — G5701 Lesion of sciatic nerve, right lower limb: Secondary | ICD-10-CM | POA: Insufficient documentation

## 2022-01-09 DIAGNOSIS — M533 Sacrococcygeal disorders, not elsewhere classified: Secondary | ICD-10-CM | POA: Diagnosis not present

## 2022-01-09 DIAGNOSIS — G894 Chronic pain syndrome: Secondary | ICD-10-CM

## 2022-01-09 DIAGNOSIS — G8929 Other chronic pain: Secondary | ICD-10-CM | POA: Diagnosis not present

## 2022-01-09 DIAGNOSIS — M25551 Pain in right hip: Secondary | ICD-10-CM | POA: Diagnosis not present

## 2022-01-09 MED ORDER — METHYLPREDNISOLONE ACETATE 40 MG/ML IJ SUSP
40.0000 mg | Freq: Once | INTRAMUSCULAR | Status: AC
  Administered 2022-01-09: 40 mg via INTRA_ARTICULAR

## 2022-01-09 MED ORDER — ROPIVACAINE HCL 2 MG/ML IJ SOLN
9.0000 mL | Freq: Once | INTRAMUSCULAR | Status: AC
  Administered 2022-01-09: 20 mL via PERINEURAL

## 2022-01-09 MED ORDER — IOHEXOL 180 MG/ML  SOLN
10.0000 mL | Freq: Once | INTRAMUSCULAR | Status: AC
  Administered 2022-01-09: 10 mL via INTRA_ARTICULAR

## 2022-01-09 MED ORDER — DEXAMETHASONE SODIUM PHOSPHATE 10 MG/ML IJ SOLN
10.0000 mg | Freq: Once | INTRAMUSCULAR | Status: AC
  Administered 2022-01-09: 10 mg
  Filled 2022-01-09: qty 1

## 2022-01-09 MED ORDER — DIAZEPAM 5 MG PO TABS
ORAL_TABLET | ORAL | Status: AC
  Filled 2022-01-09: qty 1

## 2022-01-09 MED ORDER — LIDOCAINE HCL 2 % IJ SOLN
20.0000 mL | Freq: Once | INTRAMUSCULAR | Status: AC
  Administered 2022-01-09: 400 mg

## 2022-01-09 NOTE — Progress Notes (Signed)
Safety precautions to be maintained throughout the outpatient stay will include: orient to surroundings, keep bed in low position, maintain call bell within reach at all times, provide assistance with transfer out of bed and ambulation.  

## 2022-01-09 NOTE — Progress Notes (Signed)
PROVIDER NOTE: Interpretation of information contained herein should be left to medically-trained personnel. Specific patient instructions are provided elsewhere under "Patient Instructions" section of medical record. This document was created in part using STT-dictation technology, any transcriptional errors that may result from this process are unintentional.  ?Patient: Tammy Stephens ?Type: Established ?DOB: [birth year] ?MRN: [id number] ?PCP: Default, Provider, MD  Service: Procedure ?DOS: 01/09/2022 ?Setting: Ambulatory ?Location: Ambulatory outpatient facility ?Delivery: Face-to-face Provider: Edward Jolly, MD ?Specialty: Interventional Pain Management ?Specialty designation: 09 ?Location: Outpatient facility ?Ref. Prov.: No ref. provider found   ? ?Primary Reason for Visit: Interventional Pain Management Treatment. ?CC: Hip Pain (Right hip) ? ?  ?Procedure:          Anesthesia, Analgesia, Anxiolysis:  ?Type: Diagnostic Sacroiliac Joint Steroid Injection #1 and right piriformis trigger point injection ?Region: Inferior Lumbosacral Region ?Level: PIIS (Posterior Inferior Iliac Spine) ?Laterality: Right-Side  Anesthesia: Local (1-2% Lidocaine)  ?Anxiolysis:  Valium 5 mg p.o.   ?Sedation:  Minimal   ?Guidance: Fluoroscopy         ? ? ?Position: Prone          ? ?1. Chronic right SI joint pain   ?2. Piriformis syndrome, right   ?3. Chronic pain syndrome   ? ?NAS-11 Pain score:  ? Pre-procedure: 1 /10  ? Post-procedure: 2 /10  ? ?  ?Pre-op H&P Assessment:  ?Tammy Stephens is a 66 y.o. (year old), female patient, seen today for interventional treatment. She  has a past surgical history that includes Appendectomy; Abdominal hysterectomy; Cholecystectomy; and Hip surgery (Left). Tammy Stephens has a current medication list which includes the following prescription(s): amlodipine, aspirin ec, atorvastatin, duloxetine, gabapentin, losartan, multivitamin-iron-minerals-folic acid, omeprazole, b complex vitamins, cholecalciferol,  cyclobenzaprine, ketorolac, and nortriptyline. Her primarily concern today is the Hip Pain (Right hip) ? ?Initial Vital Signs:  ?Pulse/HCG Rate: 96ECG Heart Rate: 91 ?Temp: (!) 97.3 ?F (36.3 ?C) ?Resp: 18 ?BP: 131/88 ?SpO2: 98 % ? ?BMI: Estimated body mass index is 36.76 kg/m? as calculated from the following: ?  Height as of this encounter: 5\' 2"  (1.575 m). ?  Weight as of this encounter: 201 lb (91.2 kg). ? ?Risk Assessment: ?Allergies: Reviewed. She has No Known Allergies.  ?Allergy Precautions: None required ?Coagulopathies: Reviewed. None identified.  ?Blood-thinner therapy: None at this time ?Active Infection(s): Reviewed. None identified. [name] is afebrile ? ?Site Confirmation: [name] was asked to confirm the procedure and laterality before marking the site ?Procedure checklist: Completed ?Consent: Before the procedure and under the influence of no sedative(s), amnesic(s), or anxiolytics, the patient was informed of the treatment options, risks and possible complications. To fulfill our ethical and legal obligations, as recommended by the American Medical Association's Code of Ethics, I have informed the patient of my clinical impression; the nature and purpose of the treatment or procedure; the risks, benefits, and possible complications of the intervention; the alternatives, including doing nothing; the risk(s) and benefit(s) of the alternative treatment(s) or procedure(s); and the risk(s) and benefit(s) of doing nothing. ?The patient was provided information about the general risks and possible complications associated with the procedure. These may include, but are not limited to: failure to achieve desired goals, infection, bleeding, organ or nerve damage, allergic reactions, paralysis, and death. ?In addition, the patient was informed of those risks and complications associated to the procedure, such as failure to decrease pain; infection; bleeding; organ or nerve damage with subsequent damage  to sensory, motor, and/or autonomic systems, resulting in permanent pain, numbness, and/or  weakness of one or several areas of the body; allergic reactions; (i.e.: anaphylactic reaction); and/or death. ?Furthermore, the patient was informed of those risks and complications associated with the medications. These include, but are not limited to: allergic reactions (i.e.: anaphylactic or anaphylactoid reaction(s)); adrenal axis suppression; blood sugar elevation that in diabetics may result in ketoacidosis or comma; water retention that in patients with history of congestive heart failure may result in shortness of breath, pulmonary edema, and decompensation with resultant heart failure; weight gain; swelling or edema; medication-induced neural toxicity; particulate matter embolism and blood vessel occlusion with resultant organ, and/or nervous system infarction; and/or aseptic necrosis of one or more joints. ?Finally, the patient was informed that Medicine is not an exact science; therefore, there is also the possibility of unforeseen or unpredictable risks and/or possible complications that may result in a catastrophic outcome. The patient indicated having understood very clearly. We have given the patient no guarantees and we have made no promises. Enough time was given to the patient to ask questions, all of which were answered to the patient's satisfaction. [name] has indicated that she wanted to continue with the procedure. ?Attestation: I, the ordering provider, attest that I have discussed with the patient the benefits, risks, side-effects, alternatives, likelihood of achieving goals, and potential problems during recovery for the procedure that I have provided informed consent. ?Date  Time: 01/09/2022 12:48 PM ? ?Pre-Procedure Preparation:  ?Monitoring: As per clinic protocol. Respiration, ETCO2, SpO2, BP, heart rate and rhythm monitor placed and checked for adequate function ?Safety Precautions: Patient  was assessed for positional comfort and pressure points before starting the procedure. ?Time-out: I initiated and conducted the "Time-out" before starting the procedure, as per protocol. The patient was asked to participate by confirming the accuracy of the "Time Out" information. Verification of the correct person, site, and procedure were performed and confirmed by me, the nursing staff, and the patient. "Time-out" conducted as per Joint Commission's Universal Protocol (UP.01.01.01). ?Time: 1339 ? ?Description of Procedure:          ?Target Area: Inferior, posterior, aspect of the sacroiliac fissure ?Approach: Posterior, paraspinal, ipsilateral approach. ?Area Prepped: Entire Lower Lumbosacral Region ?DuraPrep (Iodine Povacrylex [0.7% available iodine] and Isopropyl Alcohol, 74% w/w) ?Safety Precautions: Aspiration looking for blood return was conducted prior to all injections. At no point did we inject any substances, as a needle was being advanced. No attempts were made at seeking any paresthesias. Safe injection practices and needle disposal techniques used. Medications properly checked for expiration dates. SDV (single dose vial) medications used. ?Description of the Procedure: Protocol guidelines were followed. The patient was placed in position over the procedure table. The target area was identified and the area prepped in the usual manner. Skin & deeper tissues infiltrated with local anesthetic. Appropriate amount of time allowed to pass for local anesthetics to take effect. The procedure needle was advanced under fluoroscopic guidance into the sacroiliac joint until a firm endpoint was obtained. Proper needle placement secured. Negative aspiration confirmed. Solution injected in intermittent fashion, asking for systemic symptoms every 0.5cc of injectate. The needles were then removed and the area cleansed, making sure to leave some of the prepping solution back to take advantage of its long term  bactericidal properties. ?Vitals:  ? 01/09/22 1252 01/09/22 1338 01/09/22 1344  ?BP: 131/88 (!) 134/98 134/87  ?Pulse: 96    ?Resp:  18 16  ?Temp: (!) 97.3 ?F (36.3 ?C)    ?SpO2: 98% 96% 96%  ?Weight: 201  lb (91

## 2022-01-09 NOTE — Patient Instructions (Signed)
Pain Management Discharge Instructions ? ?General Discharge Instructions : ? ?If you need to reach your doctor call: Monday-Friday 8:00 am - 4:00 pm at [phone] or toll free [phone].  After clinic hours [phone] to have operator reach doctor. ? ?Bring all of your medication bottles to all your appointments in the pain clinic. ? ?To cancel or reschedule your appointment with Pain Management please remember to call 24 hours in advance to avoid a fee. ? ?Refer to the educational materials which you have been given on: General Risks, I had my Procedure. Discharge Instructions, Post Sedation. ? ?Post Procedure Instructions: ? ?The drugs you were given will stay in your system until tomorrow, so for the next 24 hours you should not drive, make any legal decisions or drink any alcoholic beverages. ? ?You may eat anything you prefer, but it is better to start with liquids then soups and crackers, and gradually work up to solid foods. ? ?Please notify your doctor immediately if you have any unusual bleeding, trouble breathing or pain that is not related to your normal pain. ? ?Depending on the type of procedure that was done, some parts of your body may feel week and/or numb.  This usually clears up by tonight or the next day. ? ?Walk with the use of an assistive device or accompanied by an adult for the 24 hours. ? ?You may use ice on the affected area for the first 24 hours.  Put ice in a Ziploc bag and cover with a towel and place against area 15 minutes on 15 minutes off.  You may switch to heat after 24 hours.Trigger Point Injection ?Trigger points are areas where you have pain. A trigger point injection is a shot given in the trigger point to help relieve pain for a few days to a few months. Common places for trigger points include the neck, shoulders, upper back, or lower back. ?A trigger point injection will not cure long-term (chronic) pain permanently. These injections do not always work for every  person. For some people, they can help to relieve pain for a few days to a few months. ?Tell a health care provider about: ?Any allergies you have. ?All medicines you are taking, including vitamins, herbs, eye drops, creams, and over-the-counter medicines. ?Any problems you or family members have had with anesthetic medicines. ?Any bleeding problems you have. ?Any surgeries you have had. ?Any medical conditions you have. ?Whether you are pregnant or may be pregnant. ?What are the risks? ?Generally, this is a safe procedure. However, problems may occur, including: ?Infection. ?Bleeding or bruising. ?Allergic reaction to the injected medicine. ?Irritation of the skin around the injection site. ?What happens before the procedure? ?Ask your health care provider about: ?Changing or stopping your regular medicines. This is especially important if you are taking diabetes medicines or blood thinners. ?Taking medicines such as aspirin and ibuprofen. These medicines can thin your blood. Do not take these medicines unless your health care provider tells you to take them. ?Taking over-the-counter medicines, vitamins, herbs, and supplements. ?What happens during the procedure? ? ?Your health care provider will feel for trigger points. A marker may be used to circle the area for the injection. ?The skin over the trigger point will be washed with a germ-killing soap. ?You may be given a medicine to help you relax (sedative). ?A thin needle is used for the injection. You may feel pain or a twitching feeling when the needle enters your skin. ?A numbing solution may be injected   into the trigger point. Sometimes a medicine to keep down inflammation is also injected. ?Your health care provider may move the needle around the area where the trigger point is located until the tightness and twitching goes away. ?After the injection, your health care provider may put gentle pressure over the injection site. ?The injection site will be  covered with a bandage (dressing). ?The procedure may vary among health care providers and hospitals. ?What can I expect after treatment? ?After treatment, you may have soreness and stiffness for 1-2 days. ?Follow these instructions at home: ?Injection site care ?Remove your dressing in a few hours, or as told by your health care provider. ?Check your injection site every day for signs of infection. Check for: ?Redness, swelling, or pain. ?Fluid or blood. ?Warmth. ?Pus or a bad smell. ?Managing pain, stiffness, and swelling ?If directed, put ice on the affected area. To do this: ?Put ice in a plastic bag. ?Place a towel between your skin and the bag. ?Leave the ice on for 20 minutes, 2-3 times a day. ?Remove the ice if your skin turns bright red. This is very important. If you cannot feel pain, heat, or cold, you have a greater risk of damage to the area. ?Activity ?If you were given a sedative during the procedure, it can affect you for several hours. Do not drive or operate machinery until your health care provider says that it is safe. ?Do not take baths, swim, or use a hot tub until your health care provider approves. ?Return to your normal activities as told by your health care provider. Ask your health care provider what activities are safe for you. ?General instructions ?If you were asked to stop your regular medicines, ask your health care provider when you may start taking them again. ?You may be asked to see an occupational or physical therapist for exercises that reduce muscle strain and stretch the area of the trigger point. ?Keep all follow-up visits. This is important. ?Contact a health care provider if: ?Your pain comes back, and it is worse than before the injection. You may need more injections. ?You have chills or a fever. ?The injection site becomes more painful, red, swollen, or warm to the touch. ?Summary ?A trigger point injection is a shot given in the trigger point to help relieve  pain. ?Common places for trigger point injections are the neck, shoulders, upper back, and lower back. ?These injections do not always work for every person, but for some people, the injections can help to relieve pain for a few days to a few months. ?Contact a health care provider if symptoms come back or if they are worse than before treatment. Also, get help if the injection site becomes more painful, red, swollen, or warm to the touch. ?This information is not intended to replace advice given to you by your health care provider. Make sure you discuss any questions you have with your health care provider. ?Document Revised: 12/21/2020 Document Reviewed: 12/21/2020 ?Elsevier Patient Education ? [place]. ?Sacroiliac (SI) Joint Injection ?Patient Information ? ?Description: The sacroiliac joint connects the scrum (very low back and tailbone) to the ilium (a pelvic bone which also forms half of the hip joint).  Normally this joint experiences very little motion.  When this joint becomes inflamed or unstable low back and or hip and pelvis pain may result.  Injection of this joint with local anesthetics (numbing medicines) and steroids can provide diagnostic information and reduce pain.  This injection is  performed with the aid of x-ray guidance into the tailbone area while you are lying on your stomach. ?  You may experience an electrical sensation down the leg while this is being done.  You may also experience numbness.  We also may ask if we are reproducing your normal pain during the injection. ? ?Conditions which may be treated SI injection: ? ?Low back, buttock, hip or leg pain ? ?Preparation for the Injection: ? ?Do not eat any solid food or dairy products within 8 hours of your appointment.  ?You may drink clear liquids up to 3 hours before appointment.  Clear liquids include water, black coffee, juice or soda.  No milk or cream please. ?You may take your regular medications, including pain  medications with a sip of water before your appointment.  Diabetics should hold regular insulin (if take separately) and take 1/2 normal NPH dose the morning of the procedure.  Carry some sugar containing items with you to your

## 2022-01-10 ENCOUNTER — Telehealth: Payer: Self-pay | Admitting: Student in an Organized Health Care Education/Training Program

## 2022-01-10 NOTE — Telephone Encounter (Signed)
Called patient she states that she is having problems with her head hurting, She states that it was very bad yesterday and better today. Rates pain 3.5 . Instructed patient to make sure she is drinking plenty of fluids . Talked about BP being high.  Instructed her to make sure to take her BP meds and to notify her PCP. Patient with understanding. She did state that she was filling better and she walked through [employer] and poper washed her driveway. ?

## 2022-01-10 NOTE — Telephone Encounter (Signed)
Patient would like to speak with a nurse about a headache. She had injections yesterday. ?

## 2022-01-23 ENCOUNTER — Ambulatory Visit
Payer: Medicare Other | Attending: Student in an Organized Health Care Education/Training Program | Admitting: Student in an Organized Health Care Education/Training Program

## 2022-01-23 ENCOUNTER — Encounter: Payer: Self-pay | Admitting: Student in an Organized Health Care Education/Training Program

## 2022-01-23 DIAGNOSIS — G894 Chronic pain syndrome: Secondary | ICD-10-CM

## 2022-01-23 DIAGNOSIS — M5116 Intervertebral disc disorders with radiculopathy, lumbar region: Secondary | ICD-10-CM

## 2022-01-23 DIAGNOSIS — M792 Neuralgia and neuritis, unspecified: Secondary | ICD-10-CM | POA: Diagnosis not present

## 2022-01-23 DIAGNOSIS — M79661 Pain in right lower leg: Secondary | ICD-10-CM

## 2022-01-23 NOTE — Progress Notes (Signed)
Patient: Tammy Stephens  Service Category: E/M  Provider: Edward Jolly, MD  ?DOB: Feb 18, 1956  DOS: 01/23/2022  Location: Office  ?MRN: [id number]  Setting: Ambulatory outpatient  Referring Provider: No ref. provider found  ?Type: Established Patient  Specialty: Interventional Pain Management  PCP: Default, Provider, MD  ?Location: Remote location  Delivery: TeleHealth    ? ?Virtual Encounter - Pain Management ?PROVIDER NOTE: Information contained herein reflects review and annotations entered in association with encounter. Interpretation of such information and data should be left to medically-trained personnel. Information provided to patient can be located elsewhere in the medical record under "Patient Instructions". Document created using STT-dictation technology, any transcriptional errors that may result from process are unintentional.  ?  ?Contact & Pharmacy ?Preferred: [phone] ?Home: [phone] (home) ?Mobile: [phone] (mobile) ?E-mail: [email or web address]  ?[facility] [address] [facility] - [place], [place] - [address] AT [address] ?[address] [place] [address] ?Phone: [phone] Fax: [phone] ?  ?Pre-screening  ?Tammy Stephens offered "in-person" vs "virtual" encounter. She indicated preferring virtual for this encounter.  ? ?Reason ?COVID-19*  Social distancing based on CDC and AMA recommendations.  ? ?I contacted Tammy Stephens on 01/23/2022 via telephone.      I clearly identified myself as Edward Jolly, MD. I verified that I was speaking with the correct person using two identifiers (Name: [name], and date of birth: 01-17-1956). ? ?Consent ?I sought verbal advanced consent from [name] for virtual visit interactions. I informed Tammy Stephens of possible security and privacy concerns, risks, and limitations associated with providing "not-in-person" medical evaluation and management services. I also informed Tammy Stephens of the availability of "in-person"  appointments. Finally, I informed her that there would be a charge for the virtual visit and that she could be  personally, fully or partially, financially responsible for it. Tammy Stephens expressed understanding and agreed to proceed.  ? ?Historic Elements   ?Tammy Stephens is a 66 y.o. year old, female patient evaluated today after our last contact on 01/10/2022. Tammy Stephens  has a past medical history of Cataract, Depression, GERD (gastroesophageal reflux disease), Hyperlipidemia, Hypertension, and Stroke (HCC). She also  has a past surgical history that includes Appendectomy; Abdominal hysterectomy; Cholecystectomy; and Hip surgery (Left). Tammy Stephens has a current medication list which includes the following prescription(s): amlodipine, aspirin ec, atorvastatin, duloxetine, gabapentin, losartan, multivitamin-iron-minerals-folic acid, omeprazole, b complex vitamins, cholecalciferol, cyclobenzaprine, ketorolac, and nortriptyline. She  reports that she has never smoked. She has never used smokeless tobacco. She reports that she does not drink alcohol and does not use drugs. Tammy Stephens has No Known Allergies.  ? ?HPI  ?Today, she is being contacted for a post-procedure assessment. ? ? ?Post-procedure evaluation  ?  ?Procedure:          Anesthesia, Analgesia, Anxiolysis:  ?Type: Diagnostic Sacroiliac Joint Steroid Injection #1 and right piriformis trigger point injection ?Region: Inferior Lumbosacral Region ?Level: PIIS (Posterior Inferior Iliac Spine) ?Laterality: Right-Side  Anesthesia: Local (1-2% Lidocaine)  ?Anxiolysis:  Valium 5 mg p.o.   ?Sedation:  Minimal   ?Guidance: Fluoroscopy         ? ? ?Position: Prone          ? ?1. Chronic right SI joint pain   ?2. Piriformis syndrome, right   ?3. Chronic pain syndrome   ? ?NAS-11 Pain score:  ? Pre-procedure: 1 /10  ? Post-procedure: 2 /10  ? ?   ?Effectiveness:  ?Initial hour after procedure: 100 %  ?  Subsequent 4-6 hours post-procedure: 100 %  ?Analgesia past  initial 6 hours: 0 %  ?Ongoing improvement:  ?Analgesic:  0% ? ? ?Assessment  ?The primary encounter diagnosis was Neuropathic pain of lower extremity, right. Diagnoses of Pain in right lower leg, Lumbar disc herniation with radiculopathy, and Chronic pain syndrome were also pertinent to this visit. ? ?Plan of Care  ? ?Unfortunately no long-term or significant benefit after diagnostic right sacroiliac joint and right piriformis injection.  At this point, the patient would like to move forward with spinal cord stimulator trial.  Of note, patient has a history of axial low back pain that radiates into her right thigh and right leg with associated paresthesias below her right knee.  She also has some intermittent weakness in her right leg.  The symptoms started in January.  No inciting or traumatic event.  She has been participating in physical therapy which has helped a small amount.  She has tried various medication trials including Aleve Advil Cymbalta gabapentin prednisone with limited response.  She has had prior right L5-S1 transforaminal epidural steroid injection performed on 10/31/2021 and 11/17/2021 with very limited response.  She has been evaluated by Dr. Marcell BarlowYarborough with neurosurgery and is being referred here for consideration of spinal cord stimulator trial for lower extremity neuropathic pain.  Of note patient does have a history of total right left hip arthroplasty.  She is currently on Cymbalta, gabapentin, nortriptyline.  She is also on alprazolam for anxiety. ? ?Conservative measures:  ?[facility], has noticed some relief.  ?Multimodal medical therapy including regular antiinflammatories: aleve, advil, duloxetine, gabapentin, prednisone ?Injections:  ?01/09/22: RIGHT SI joint & right right piriformis injection performed in 2023 (no relief) ?has had epidural steroid injections ?11/17/21: Right L5-S1 TF ESI (no relief) ?10/31/21: Right L5-S1 TF ESI (10%  relief) ? ?I have  discussed  percutaneous spinal cord stimulator trial with the patient in detail. I explained to the patient that they will have an external power source and programmer which the patient will use for 7 days. There will be daily communication with the stimulator company and the patient. A possible need for a mid-trial clinic visit to give the patient the best chance of success.  ? ?Patient has completed thorough psychosocial behavioral evaluation has been cleared.  ? ?Some of patient's pain does seem to be mechanical in nature, with some component of neurogenic pain as well. We discussed the indications for spinal cord stimulation, specifically stating that it is typically better for neuropathic and appendicular pain, but that we have had some success in the treatment of low back and hip pain.  ? ?Patient is interested in proceeding with spinal cord stimulation trial. She understands that this may not be successful, and that spinal cord stimulation in general is not a "magic bullet." ?  ?I have already evaluated the patient's interlaminar spaces under live fluoroscopy and they appear patent for safe percutaneous access for spinal cord stim trial ? ?We had a lengthy and very detailed discussion of all the risks, benefits, alternatives, and rationale of surgery as well as the option of continuing nonsurgical therapies. We specifically discussed the risks of temporary or permanent worsened neurologic injury, no symptomatic relief or pain made worse after procedure, and also the need for future surgery (due to infection, CSF leak, bleeding, adjacent segment issues, bone-healing difficulties, and other related issues). No guarantees of outcome were made or implied and he is eager to proceed and presents  for definitive treatment. ?  ?Ms Gasparini told me that all of herquestions were answered thoroughly and to her satisfaction. Confidence and understanding of the discussed risks and consequences of   treatment was expressed and he accepted these risks and was eager to proceed with procedure.  ? ?Issues concerning treatment and diagnosis were discussed with the patient. There are no barriers to understanding the pla

## 2022-01-24 NOTE — Patient Instructions (Signed)
______________________________________________________________________  Preparing for Procedure with Sedation  NOTICE: Due to recent regulatory changes, starting on April 25, 2021, procedures requiring intravenous (IV) sedation will no longer be performed at the [facility].  These types of procedures are required to be performed at [facility] ambulatory surgery facility.  We are very sorry for the inconvenience.  Procedure appointments are limited to planned procedures: No Prescription Refills. No disability issues will be discussed. No medication changes will be discussed.  Instructions: Oral Intake: Do not eat or drink anything for at least 8 hours prior to your procedure. (Exception: Blood Pressure Medication. See below.) Transportation: A driver is required. You may not drive yourself after the procedure. Blood Pressure Medicine: Do not forget to take your blood pressure medicine with a sip of water the morning of the procedure. If your Diastolic (lower reading) is above 100 mmHg, elective cases will be cancelled/rescheduled. Blood thinners: These will need to be stopped for procedures. Notify our staff if you are taking any blood thinners. Depending on which one you take, there will be specific instructions on how and when to stop it. Diabetics on insulin: Notify the staff so that you can be scheduled 1st case in the morning. If your diabetes requires high dose insulin, take only  of your normal insulin dose the morning of the procedure and notify the staff that you have done so. Preventing infections: Shower with an antibacterial soap the morning of your procedure. Build-up your immune system: Take 1000 mg of Vitamin C with every meal (3 times a day) the day prior to your procedure. Antibiotics: Inform the staff if you have a condition or reason that requires you to take antibiotics before dental procedures. Pregnancy: If you are pregnant, call and cancel the procedure. Sickness: If  you have a cold, fever, or any active infections, call and cancel the procedure. Arrival: You must be in the facility at least 30 minutes prior to your scheduled procedure. Children: Do not bring children with you. Dress appropriately: There is always the possibility that your clothing may get soiled. Valuables: Do not bring any jewelry or valuables.  Reasons to call and reschedule or cancel your procedure: (Following these recommendations will minimize the risk of a serious complication.) Surgeries: Avoid having procedures within 2 weeks of any surgery. (Avoid for 2 weeks before or after any surgery). Flu Shots: Avoid having procedures within 2 weeks of a flu shots. (Avoid for 2 weeks before or after immunizations). Barium: Avoid having a procedure within 7-10 days after having had a radiological study involving the use of radiological contrast. (Myelograms, Barium swallow or enema study). Heart attacks: Avoid any elective procedures or surgeries for the initial 6 months after a "Myocardial Infarction" (Heart Attack). Blood thinners: It is imperative that you stop these medications before procedures. Let us know if you if you take any blood thinner.  Infection: Avoid procedures during or within two weeks of an infection (including chest colds or gastrointestinal problems). Symptoms associated with infections include: Localized redness, fever, chills, night sweats or profuse sweating, burning sensation when voiding, cough, congestion, stuffiness, runny nose, sore throat, diarrhea, nausea, vomiting, cold or Flu symptoms, recent or current infections. It is specially important if the infection is over the area that we intend to treat. Heart and lung problems: Symptoms that may suggest an active cardiopulmonary problem include: cough, chest pain, breathing difficulties or shortness of breath, dizziness, ankle swelling, uncontrolled high or unusually low blood pressure, and/or palpitations. If you are    experiencing any of these symptoms, cancel your procedure and contact your primary care physician for an evaluation.  Remember:  Regular Business hours are:  Monday to Thursday 8:00 AM to 4:00 PM  Provider's Schedule: Francisco Naveira, MD:  Procedure days: Tuesday and Thursday 7:30 AM to 4:00 PM  Bilal Lateef, MD:  Procedure days: Monday and Wednesday 7:30 AM to 4:00 PM ______________________________________________________________________   

## 2022-02-06 ENCOUNTER — Encounter: Payer: Self-pay | Admitting: Student in an Organized Health Care Education/Training Program

## 2022-02-06 ENCOUNTER — Ambulatory Visit (HOSPITAL_BASED_OUTPATIENT_CLINIC_OR_DEPARTMENT_OTHER): Payer: Medicare Other | Admitting: Student in an Organized Health Care Education/Training Program

## 2022-02-06 ENCOUNTER — Ambulatory Visit
Admission: RE | Admit: 2022-02-06 | Discharge: 2022-02-06 | Disposition: A | Discharge status: Home / Self Care | Payer: Medicare Other | Source: Ambulatory Visit | Attending: Student in an Organized Health Care Education/Training Program | Admitting: Student in an Organized Health Care Education/Training Program

## 2022-02-06 VITALS — BP 102/46 | HR 93 | Temp 98.1°F | Resp 17 | Ht 62.0 in | Wt 200.0 lb

## 2022-02-06 DIAGNOSIS — M79661 Pain in right lower leg: Secondary | ICD-10-CM | POA: Diagnosis present

## 2022-02-06 DIAGNOSIS — M792 Neuralgia and neuritis, unspecified: Secondary | ICD-10-CM

## 2022-02-06 DIAGNOSIS — G894 Chronic pain syndrome: Secondary | ICD-10-CM | POA: Diagnosis present

## 2022-02-06 MED ORDER — SODIUM CHLORIDE (PF) 0.9 % IJ SOLN
INTRAMUSCULAR | Status: AC
Start: 2022-02-06 — End: ?
  Filled 2022-02-06: qty 10

## 2022-02-06 MED ORDER — FENTANYL CITRATE (PF) 100 MCG/2ML IJ SOLN
INTRAMUSCULAR | Status: AC
  Filled 2022-02-06: qty 2

## 2022-02-06 MED ORDER — ROPIVACAINE HCL 2 MG/ML IJ SOLN
INTRAMUSCULAR | Status: AC
  Filled 2022-02-06: qty 20

## 2022-02-06 MED ORDER — CEPHALEXIN 500 MG PO CAPS: 500.0000 mg | ORAL_CAPSULE | Freq: Four times a day (QID) | ORAL | 0 refills | Status: AC

## 2022-02-06 MED ORDER — LIDOCAINE HCL 2 % IJ SOLN
20.0000 mL | Freq: Once | INTRAMUSCULAR | Status: AC
  Administered 2022-02-06: 400 mg

## 2022-02-06 MED ORDER — LIDOCAINE HCL 2 % IJ SOLN
INTRAMUSCULAR | Status: AC
  Filled 2022-02-06: qty 20

## 2022-02-06 MED ORDER — FENTANYL CITRATE (PF) 100 MCG/2ML IJ SOLN
25.0000 ug | INTRAMUSCULAR | Status: DC | PRN
  Administered 2022-02-06: 75 ug via INTRAVENOUS

## 2022-02-06 MED ORDER — ONDANSETRON 4 MG PO TBDP
ORAL_TABLET | ORAL | Status: AC
  Filled 2022-02-06: qty 2

## 2022-02-06 MED ORDER — CEFAZOLIN SODIUM 1 G IJ SOLR
INTRAMUSCULAR | Status: AC
  Filled 2022-02-06: qty 20

## 2022-02-06 MED ORDER — MIDAZOLAM HCL 5 MG/5ML IJ SOLN
INTRAMUSCULAR | Status: AC
  Filled 2022-02-06: qty 5

## 2022-02-06 MED ORDER — CEFAZOLIN SODIUM-DEXTROSE 1-4 GM/50ML-% IV SOLN
1.0000 g | Freq: Once | INTRAVENOUS | Status: AC
  Administered 2022-02-06: 2 g via INTRAVENOUS

## 2022-02-06 MED ORDER — ONDANSETRON 8 MG PO TBDP
8.0000 mg | ORAL_TABLET | Freq: Once | ORAL | Status: DC
  Filled 2022-02-06: qty 1

## 2022-02-06 MED ORDER — LACTATED RINGERS IV SOLN
1000.0000 mL | Freq: Once | INTRAVENOUS | Status: AC
  Administered 2022-02-06: 1000 mL via INTRAVENOUS

## 2022-02-06 MED ORDER — MIDAZOLAM HCL 5 MG/5ML IJ SOLN
0.5000 mg | Freq: Once | INTRAMUSCULAR | Status: AC
  Administered 2022-02-06: 1 mg via INTRAVENOUS

## 2022-02-06 NOTE — Progress Notes (Signed)
Safety precautions to be maintained throughout the outpatient stay will include: orient to surroundings, keep bed in low position, maintain call bell within reach at all times, provide assistance with transfer out of bed and ambulation.  

## 2022-02-06 NOTE — Patient Instructions (Addendum)
Today we did the following -We have done a Spinal Cord Stimulator Trial with [employer]  -As long as the leads are in place, do not bathe or shower. You may sponge bathe.  -While the lead is in place, please limit the bending, lifting, or twisting because the lead can move.  -The things we want to see is if your pain improves (and by what percentage), if you can do more activity (don't overdo it), and if you can use less of your "as needed" medicine. Do not stop long acting medicines like methadone, oxycontin, MS Contin, etc without checking with us.  -It is VERY important that you pick up the antibiotics we prescribed, Keflex, on your way home from the trial and take them as prescribed(4 times a day), starting today, for as long as the lead is in place.  -The Spina Cord Stimulator Representative will be in contact with you while the lead is in place to make sure the trial goes as well as possible.  -Please contact us with any questions or concerns at any time during the trial.   -If you start running a fever over 100 degrees, have severe back pain, or new pain running down the legs, or drainage coming from the lead site, contact us immediately and/or go to the emergency room.  -Please do not restart any sort of medication that can thin your blood such as Aspirin, ibuprofen, motrin, aleve, plavix, coumadin, etc. If you aren't sure, call and ask.  -We will have you return in 7 days to have the lead removed. If this is successful, at that point we can go over the details about the permanent implant.  ____________________________________________________________________________________________  Post-Procedure Discharge Instructions  Instructions: Apply ice:  Purpose: This will minimize any swelling and discomfort after procedure.  When: Day of procedure, as soon as you get home. How: Fill a plastic sandwich bag with crushed ice. Cover it with a small towel and apply to injection site. How  long: (15 min on, 15 min off) Apply for 15 minutes then remove x 15 minutes.  Repeat sequence on day of procedure, until you go to bed. Apply heat:  Purpose: To treat any soreness and discomfort from the procedure. When: Starting the next day after the procedure. How: Apply heat to procedure site starting the day following the procedure. How long: May continue to repeat daily, until discomfort goes away. Food intake: Start with clear liquids (like water) and advance to regular food, as tolerated.  Physical activities: Keep activities to a minimum for the first 8 hours after the procedure. After that, then as tolerated. Driving: If you have received any sedation, be responsible and do not drive. You are not allowed to drive for 24 hours after having sedation. Blood thinner: (Applies only to those taking blood thinners) You may restart your blood thinner 6 hours after your procedure. Insulin: (Applies only to Diabetic patients taking insulin) As soon as you can eat, you may resume your normal dosing schedule. Infection prevention: Keep procedure site clean and dry. Shower daily and clean area with soap and water. Post-procedure Pain Diary: Extremely important that this be done correctly and accurately. Recorded information will be used to determine the next step in treatment. For the purpose of accuracy, follow these rules: Evaluate only the area treated. Do not report or include pain from an untreated area. For the purpose of this evaluation, ignore all other areas of pain, except for the treated area. After your procedure, avoid taking   a long nap and attempting to complete the pain diary after you wake up. Instead, set your alarm clock to go off every hour, on the hour, for the initial 8 hours after the procedure. Document the duration of the numbing medicine, and the relief you are getting from it. Do not go to sleep and attempt to complete it later. It will not be accurate. If you received sedation,  it is likely that you were given a medication that may cause amnesia. Because of this, completing the diary at a later time may cause the information to be inaccurate. This information is needed to plan your care. Follow-up appointment: Keep your post-procedure follow-up evaluation appointment after the procedure (usually 2 weeks for most procedures, 6 weeks for radiofrequencies). DO NOT FORGET to bring you pain diary with you.   Expect: (What should I expect to see with my procedure?) From numbing medicine (AKA: Local Anesthetics): Numbness or decrease in pain. You may also experience some weakness, which if present, could last for the duration of the local anesthetic. Onset: Full effect within 15 minutes of injected. Duration: It will depend on the type of local anesthetic used. On the average, 1 to 8 hours.  From steroids (Applies only if steroids were used): Decrease in swelling or inflammation. Once inflammation is improved, relief of the pain will follow. Onset of benefits: Depends on the amount of swelling present. The more swelling, the longer it will take for the benefits to be seen. In some cases, up to 10 days. Duration: Steroids will stay in the system x 2 weeks. Duration of benefits will depend on multiple posibilities including persistent irritating factors. Side-effects: If present, they may typically last 2 weeks (the duration of the steroids). Frequent: Cramps (if they occur, drink Gatorade and take over-the-counter Magnesium 450-500 mg once to twice a day); water retention with temporary weight gain; increases in blood sugar; decreased immune system response; increased appetite. Occasional: Facial flushing (red, warm cheeks); mood swings; menstrual changes. Uncommon: Long-term decrease or suppression of natural hormones; bone thinning. (These are more common with higher doses or more frequent use. This is why we prefer that our patients avoid having any injection therapies in other  practices.)  Very Rare: Severe mood changes; psychosis; aseptic necrosis. From procedure: Some discomfort is to be expected once the numbing medicine wears off. This should be minimal if ice and heat are applied as instructed.  Call if: (When should I call?) You experience numbness and weakness that gets worse with time, as opposed to wearing off. New onset bowel or bladder incontinence. (Applies only to procedures done in the spine)  Emergency Numbers: Durning business hours (Monday - Thursday, 8:00 AM - 4:00 PM) (Friday, 9:00 AM - 12:00 Noon): [phone] After hours: [phone] NOTE: If you are having a problem and are unable connect with, or to talk to a provider, then go to your nearest urgent care or emergency department. If the problem is serious and urgent, please call 911. ____________________________________________________________________________________________   

## 2022-02-06 NOTE — Progress Notes (Signed)
PROVIDER NOTE: Interpretation of information contained herein should be left to medically-trained personnel. Specific patient instructions are provided elsewhere under "Patient Instructions" section of medical record. This document was created in part using STT-dictation technology, any transcriptional errors that may result from this process are unintentional.  ?Patient: Tammy Stephens ?Type: Established ?DOB: December 14, 1955 ?MRN: [id number] ?PCP: Default, Provider, MD  Service: Procedure ?DOS: 02/06/2022 ?Setting: Ambulatory ?Location: Ambulatory outpatient facility ?Delivery: Face-to-face Provider: Edward Jolly, MD ?Specialty: Interventional Pain Management ?Specialty designation: 09 ?Location: Outpatient facility ?Ref. Prov.: No ref. provider found   ? ?Primary Reason for Admission: Surgical management of chronic pain condition. ?  ?Procedure:             ? Type: Trial Spinal Cord Neurostimulator Implant (Percutaneous, interlaminar, posterior epidural placement) [employer] ?Laterality: Bilateral (-50)  ?Level: Lumbar  ?Imaging: Fluoroscopic guidance ?Anesthesia: Local anesthesia (1-2% Lidocaine) ?Sedation: Moderate conscious sedation. ?DOS: 02/06/2022  ?Performed by: Edward Jolly, MD ? ?Purpose: Diagnostic. To determine if a permanent implant may be effective in controlling some or all of Tammy Stephens's chronic pain symptoms.  ?Indications: Neuropathic pain severe enough to impact quality of life or function. ?Rationale (medical necessity): procedure needed and proper for the diagnosis and/or treatment of Tammy Stephens's medical symptoms and needs. ?1. Neuropathic pain of lower extremity, right   ?2. Pain in right lower leg   ?3. Chronic pain syndrome   ? ?NAS-11 Pain score:  ? Pre-procedure: 3 /10  ? Post-procedure: 0-No pain/10  ? ?  ?Target: Posterior epidural space over the dorsal columns of the spinal cord. ?Location: Posterior intraspinal canal ?Region: Lumbar  ?Approach: Translaminar percutaneous  ?Type of  procedure: Surgical  ? ?Position / Prep / Materials:  ?Position: Prone  ?Prep solution: DuraPrep (Iodine Povacrylex [0.7% available iodine] and Isopropyl Alcohol, 74% w/w) ?Prep Area: Entire  Posterior  Thoracolumbar  Area  ?Materials:  ?Tray: Implant tray ?Needle(s):  ?Type: Epidural  ?Gauge (G): 14  ?Length: 3.5-in  ?Qty: 1 ? ?Pre-op H&P Assessment:  ?Tammy Stephens is a 66 y.o. (year old), female patient, seen today for interventional treatment. She  has a past surgical history that includes Appendectomy; Abdominal hysterectomy; Cholecystectomy; and Hip surgery (Left). ? ?Initial Vital Signs:  ?Pulse/EKG Rate: 93ECG Heart Rate: 88 ?Temp: 98.1 ?F (36.7 ?C) ?Resp: 16 ?BP: 138/86 ?SpO2: 99 % ? ?BMI: Estimated body mass index is 36.58 kg/m? as calculated from the following: ?  Height as of this encounter: 5\' 2"  (1.575 m). ?  Weight as of this encounter: 200 lb (90.7 kg). ? ?Risk Assessment: ?Allergies: Reviewed. She has No Known Allergies.  ?Allergy Precautions: None required ?Coagulopathies: Reviewed. None identified.  ?Blood-thinner therapy: None at this time ?Active Infection(s): Reviewed. None identified. Tammy Stephens is afebrile ? ?Site Confirmation: Tammy Stephens was asked to confirm the procedure and laterality before marking the site, which she did. ?Procedure checklist: Completed ?Consent: Before the procedure and under the influence of no sedative(s), amnesic(s), or anxiolytics, the patient was informed of the treatment options, risks and possible complications. To fulfill our ethical and legal obligations, as recommended by the American Medical Association's Code of Ethics, I have informed the patient of my clinical impression; the nature and purpose of the treatment or procedure; the risks, benefits, and possible complications of the intervention; the alternatives, including doing nothing; the risk(s) and benefit(s) of the alternative treatment(s) or procedure(s); and the risk(s) and benefit(s) of doing  nothing. ? ?Tammy Stephens was provided with information about the general risks and possible complications  associated with most interventional procedures. These include, but are not limited to: failure to achieve desired goals, infection, bleeding, organ or nerve damage, allergic reactions, paralysis, and/or death. ? ?In addition, she was informed of those risks and possible complications associated to this particular procedure, which include, but are not limited to: damage to the implant; failure to decrease pain; local, systemic, or serious CNS infections, intraspinal abscess with possible cord compression and paralysis, or life-threatening such as meningitis; intrathecal and/or epidural bleeding with formation of hematoma with possible spinal cord compression and permanent paralysis; organ damage; nerve injury or damage with subsequent sensory, motor, and/or autonomic system dysfunction, resulting in transient or permanent pain, numbness, and/or weakness of one or several areas of the body; allergic reactions, either minor or major life-threatening, such as anaphylactic or anaphylactoid reactions. ? ?Furthermore, Tammy Stephens was informed of those risks and complications associated with the medications. These include, but are not limited to: allergic reactions (i.e.: anaphylactic or anaphylactoid reactions); arrhythmia;  Hypotension/hypertension; cardiovascular collapse; respiratory depression and/or shortness of breath; swelling or edema; medication-induced neural toxicity; particulate matter embolism and blood vessel occlusion with resultant organ, and/or nervous system infarction and permanent paralysis. ? ?Finally, she was informed that Medicine is not an exact science; therefore, there is also the possibility of unforeseen or unpredictable risks and/or possible complications that may result in a catastrophic outcome. The patient indicated having understood very clearly. We have given the patient no guarantees  and we have made no promises. Enough time was given to the patient to ask questions, all of which were answered to the patient's satisfaction. Tammy Stephens has indicated that she wanted to continue with the procedure. ?Attestation: I, the ordering provider, attest that I have discussed with the patient the benefits, risks, side-effects, alternatives, likelihood of achieving goals, and potential problems during recovery for the procedure that I have provided informed consent. ?Date  Time: 02/06/2022  7:52 AM ? ?Pre-Procedure Preparation:  ?Monitoring: As per clinic protocol. Respiration, ETCO2, SpO2, BP, heart rate and rhythm monitor placed and checked for adequate function ?Safety Precautions: Patient was assessed for positional comfort and pressure points before starting the procedure. ?Time-out: I initiated and conducted the "Time-out" before starting the procedure, as per protocol. The patient was asked to participate by confirming the accuracy of the "Time Out" information. Verification of the correct person, site, and procedure were performed and confirmed by me, the nursing staff, and the patient. "Time-out" conducted as per Joint Commission's Universal Protocol (UP.01.01.01). ?Time: 2130 ? ?Description/Narrative of Procedure:          ?Rationale (medical necessity): procedure needed and proper for the diagnosis and/or treatment of the patient's medical symptoms and needs. ?Procedural Technique Safety Precautions: Aspiration looking for blood return was conducted prior to all injections. At no point did we inject any substances, as a needle was being advanced. No attempts were made at seeking any paresthesias. Safe injection practices and needle disposal techniques used. Medications properly checked for expiration dates. SDV (single dose vial) medications used. ?Description of the Procedure: Protocol guidelines were followed. The patient was assisted into a comfortable position. The target area was identified  and the area prepped in the usual manner. Skin & deeper tissues infiltrated with local anesthetic. Appropriate amount of time allowed to pass for local anesthetics to take effect. The procedure needles were then advanc

## 2022-02-06 NOTE — Addendum Note (Signed)
Addended by: Edward Jolly on: 02/06/2022 12:03 PM ? ? Modules accepted: Orders ? ?

## 2022-02-07 ENCOUNTER — Telehealth: Payer: Self-pay | Admitting: *Deleted

## 2022-02-07 NOTE — Telephone Encounter (Signed)
Patient doing well this morning. Denies any problems. Taking PO antibiotics as prescribed. States continues to get pain relief from SCS. ?

## 2022-02-13 ENCOUNTER — Ambulatory Visit
Admission: RE | Admit: 2022-02-13 | Discharge: 2022-02-13 | Disposition: A | Discharge status: Home / Self Care | Payer: Medicare Other | Source: Ambulatory Visit | Attending: Student in an Organized Health Care Education/Training Program | Admitting: Student in an Organized Health Care Education/Training Program

## 2022-02-13 ENCOUNTER — Ambulatory Visit (HOSPITAL_BASED_OUTPATIENT_CLINIC_OR_DEPARTMENT_OTHER): Payer: Medicare Other | Admitting: Student in an Organized Health Care Education/Training Program

## 2022-02-13 ENCOUNTER — Encounter: Payer: Self-pay | Admitting: Student in an Organized Health Care Education/Training Program

## 2022-02-13 ENCOUNTER — Other Ambulatory Visit: Payer: Self-pay

## 2022-02-13 VITALS — BP 131/97 | HR 86 | Temp 97.4°F | Resp 16 | Ht 62.0 in | Wt 200.0 lb

## 2022-02-13 DIAGNOSIS — M792 Neuralgia and neuritis, unspecified: Secondary | ICD-10-CM

## 2022-02-13 DIAGNOSIS — M79661 Pain in right lower leg: Secondary | ICD-10-CM | POA: Insufficient documentation

## 2022-02-13 DIAGNOSIS — G894 Chronic pain syndrome: Secondary | ICD-10-CM | POA: Diagnosis present

## 2022-02-13 NOTE — Progress Notes (Signed)
PROVIDER NOTE: Information contained herein reflects review and annotations entered in association with encounter. Interpretation of such information and data should be left to medically-trained personnel. Information provided to patient can be located elsewhere in the medical record under "Patient Instructions". Document created using STT-dictation technology, any transcriptional errors that may result from process are unintentional.    Patient: Tammy Stephens  Service Category: E/M  Provider: Gillis Santa, MD  DOB: [birth year]  DOS: 02/13/2022  Specialty: Interventional Pain Management  MRN: [id number]  Setting: Ambulatory outpatient  PCP: Default, Provider, MD  Type: Established Patient    Referring Provider: No ref. provider found  Location: Office  Delivery: Face-to-face     HPI  Tammy Stephens, a 66 y.o. year old female, is here today because of her Neuropathic pain of lower extremity, right [M79.2]. Tammy Stephens primary complain today is Back Pain (Lumbar bilateral ) Last encounter: My last encounter with her was on 02/06/2022.  Pain Assessment: Severity of Chronic pain is reported as a 1 /10. Location: Back Lower, Left, Right/denies. Onset: More than a month ago. Quality: Discomfort, Aching, Burning. Timing: Intermittent. Modifying factor(s): SCS has helped approx 70%. Vitals:  height is _0  (1.575 m) and weight is 200 lb (90.7 kg). Her temporal temperature is 97.4 F (36.3 C) (abnormal). Her blood pressure is 131/97 (abnormal) and her pulse is 86. Her respiration is 16 and oxygen saturation is 96%.   Reason for encounter: Removal of [employer] spinal cord stimulator trial leads  Tammy Stephens presents today for removal of her spinal cord stimulator trial leads.  She had a successful spinal cord stimulator trial and endorses 70% pain relief in regards to her low back and radiating right leg pain.  She states that she is able to walk and stand for approximately 15 to 20 minutes before she has to  sit down.  This is a significant improvement from before where she was only able to stand for about 3 to 5 minutes before having to sit down.  She would like to move forward with spinal cord stimulator implant.  I will send her to Tammy Stephens to discuss.  ROS  Constitutional: Denies any fever or chills Gastrointestinal: No reported hemesis, hematochezia, vomiting, or acute GI distress Musculoskeletal: Denies any acute onset joint swelling, redness, loss of ROM, or weakness Neurological:  Improvement and lower back and right leg pain  Medication Review  DULoxetine, amLODipine, aspirin EC, atorvastatin, b complex vitamins, cephALEXin, cholecalciferol, cyclobenzaprine, gabapentin, ketorolac, losartan, multivitamin-iron-minerals-folic acid, nortriptyline, and omeprazole  History Review  Allergy: Tammy Stephens has No Known Allergies. Drug: Tammy Stephens  reports no history of drug use. Alcohol:  reports no history of alcohol use. Tobacco:  reports that she has never smoked. She has never used smokeless tobacco. Social: Tammy Stephens  reports that she has never smoked. She has never used smokeless tobacco. She reports that she does not drink alcohol and does not use drugs. Medical:  has a past medical history of Cataract, Depression, GERD (gastroesophageal reflux disease), Hyperlipidemia, Hypertension, and Stroke ([place]). Surgical: [name]  has a past surgical history that includes Appendectomy; Abdominal hysterectomy; Cholecystectomy; and Hip surgery (Left). Family: family history is not on file.  Laboratory Chemistry Profile   Renal No results found for: BUN, CREATININE, LABCREA, BCR, GFR, GFRAA, GFRNONAA, LABVMA, EPIRU, EPINEPH24HUR, NOREPRU, NOREPI24HUR, DOPARU, [id number]  Hepatic No results found for: AST, ALT, ALBUMIN, ALKPHOS, HCVAB, AMYLASE, LIPASE, AMMONIA  Electrolytes No results found for: NA, K, CL, CALCIUM, MG, PHOS  Bone No results found for: VD25OH, [id number], [id number],  [id number], 25OHVITD1, 25OHVITD2, 25OHVITD3, TESTOFREE, TESTOSTERONE  Inflammation (CRP: Acute Phase) (ESR: Chronic Phase) No results found for: CRP, ESRSEDRATE, LATICACIDVEN       Note: Above Lab results reviewed.    Physical Exam  General appearance: Well nourished, well developed, and well hydrated. In no apparent acute distress Mental status: Alert, oriented x 3 (person, place, & time)       Respiratory: No evidence of acute respiratory distress Eyes: PERLA Vitals: BP (!) 131/97 (BP Location: Right Arm, Patient Position: Sitting, Cuff Size: Normal)   Pulse 86   Temp (!) 97.4 F (36.3 C) (Temporal)   Resp 16   Ht _0  (1.575 m)   Wt 200 lb (90.7 kg)   SpO2 96%   BMI 36.58 kg/m  BMI: Estimated body mass index is 36.58 kg/m as calculated from the following:   Height as of this encounter: _1  (1.575 m).   Weight as of this encounter: 200 lb (90.7 kg). Ideal: Ideal body weight: 50.1 kg (110 lb 7.2 oz) Adjusted ideal body weight: 66.3 kg (146 lb 4.3 oz)  Spinal cord stimulator leads removed under live fluoroscopy, tips intact.  Insertion site clean, not erythematous, nontender.  5 out of 5 strength bilateral lower extremity: Plantar flexion, dorsiflexion, knee flexion, knee extension.   Assessment   Diagnosis Status  1. Neuropathic pain of lower extremity, right   2. Pain in right lower leg   3. Chronic pain syndrome    Responding Responding Responding     Plan of Care  Successful spinal cord stimulator trial with [employer].  Referral to Tammy Stephens to discuss permanent implant.    Orders:  Orders Placed This Encounter  Procedures   DG PAIN CLINIC C-ARM 1-60 MIN NO REPORT    Intraoperative interpretation by procedural physician at [place].    Standing Status:   Standing    Number of Occurrences:   1    Order Specific Question:   Reason for exam:    Answer:   Assistance in needle guidance and placement for procedures requiring needle  placement in or near specific anatomical locations not easily accessible without such assistance.   Ambulatory referral to Neurosurgery    Referral Priority:   Routine    Referral Type:   Surgical    Referral Reason:   Specialty Services Required    Referred to Provider:   Meade Maw, MD    Requested Specialty:   Neurosurgery    Number of Visits Requested:   1   Follow-up plan:   Return if symptoms worsen or fail to improve.    Recent Visits Date Type Provider Dept  02/06/22 Procedure visit Gillis Santa, MD [facility]  01/23/22 Office Visit Gillis Santa, MD [facility]  01/09/22 Procedure visit Gillis Santa, MD [facility]  01/05/22 Office Visit Gillis Santa, MD [facility]  Showing recent visits within past 90 days and meeting all other requirements Today's Visits Date Type Provider Dept  02/13/22 Procedure visit Gillis Santa, MD [facility]  Showing today's visits and meeting all other requirements Future Appointments No visits were found meeting these conditions. Showing future appointments within next 90 days and meeting all other requirements  I discussed the assessment and treatment plan with the patient. The patient was provided an opportunity to ask questions and all were answered. The patient agreed with the plan and demonstrated an understanding of the instructions.  Patient advised to call back or seek an in-person evaluation if the symptoms or condition worsens.  Duration of encounter: 31mnutes.  Note by: BGillis Santa MD Date: 02/13/2022; Time: 9:12 AM

## 2022-02-13 NOTE — Progress Notes (Signed)
Safety precautions to be maintained throughout the outpatient stay will include: orient to surroundings, keep bed in low position, maintain call bell within reach at all times, provide assistance with transfer out of bed and ambulation.  

## 2022-02-14 ENCOUNTER — Telehealth: Payer: Self-pay

## 2022-02-14 NOTE — Telephone Encounter (Signed)
Post procedure phone call.  Patient states she is doing well.  

## 2022-02-22 ENCOUNTER — Other Ambulatory Visit: Payer: Self-pay | Admitting: Neurosurgery

## 2022-03-03 ENCOUNTER — Other Ambulatory Visit: Payer: Self-pay

## 2022-03-03 ENCOUNTER — Encounter
Admission: RE | Admit: 2022-03-03 | Discharge: 2022-03-03 | Disposition: A | Discharge status: Home / Self Care | Payer: Medicare Other | Source: Ambulatory Visit | Attending: Neurosurgery | Admitting: Neurosurgery

## 2022-03-03 VITALS — BP 132/87 | HR 93 | Resp 18

## 2022-03-03 DIAGNOSIS — Z01812 Encounter for preprocedural laboratory examination: Secondary | ICD-10-CM

## 2022-03-03 DIAGNOSIS — Z01818 Encounter for other preprocedural examination: Secondary | ICD-10-CM | POA: Diagnosis present

## 2022-03-03 DIAGNOSIS — Z01810 Encounter for preprocedural cardiovascular examination: Secondary | ICD-10-CM

## 2022-03-03 DIAGNOSIS — I10 Essential (primary) hypertension: Secondary | ICD-10-CM | POA: Diagnosis not present

## 2022-03-03 HISTORY — DX: Personal history of urinary calculi: Z87.442

## 2022-03-03 HISTORY — DX: Other complications of anesthesia, initial encounter: T88.59XA

## 2022-03-03 HISTORY — DX: Unspecified osteoarthritis, unspecified site: M19.90

## 2022-03-03 LAB — BASIC METABOLIC PANEL
Anion gap: 7 (ref 5–15)
BUN: 12 mg/dL (ref 8–23)
CO2: 31 mmol/L (ref 22–32)
Calcium: 8.9 mg/dL (ref 8.9–10.3)
Chloride: 102 mmol/L (ref 98–111)
Creatinine, Ser: 0.83 mg/dL (ref 0.44–1.00)
GFR, Estimated: >60 mL/min (ref >60)
Glucose, Bld: 91 mg/dL (ref 70–99)
Potassium: 3.5 mmol/L (ref 3.5–5.1)
Sodium: 140 mmol/L (ref 135–145)

## 2022-03-03 LAB — CBC
HCT: 42.6 % (ref 36.0–46.0)
Hemoglobin: 13.2 g/dL (ref 12.0–15.0)
MCH: 29.3 pg (ref 26.0–34.0)
MCHC: 31 g/dL (ref 30.0–36.0)
MCV: 94.7 fL (ref 80.0–100.0)
Platelets: 297 10*3/uL (ref 150–400)
RBC: 4.5 MIL/uL (ref 3.87–5.11)
RDW: 13.2 % (ref 11.5–15.5)
WBC: 8.3 10*3/uL (ref 4.0–10.5)
nRBC: 0 % (ref 0.0–0.2)

## 2022-03-03 LAB — URINALYSIS, ROUTINE W REFLEX MICROSCOPIC
Bilirubin Urine: NEGATIVE
Glucose, UA: NEGATIVE mg/dL
Hgb urine dipstick: NEGATIVE
Ketones, ur: 5 mg/dL — AB
Nitrite: NEGATIVE
Protein, ur: NEGATIVE mg/dL
Specific Gravity, Urine: 1.017 (ref 1.005–1.030)
pH: 5 (ref 5.0–8.0)

## 2022-03-03 LAB — SURGICAL PCR SCREEN
MRSA, PCR: NEGATIVE
Staphylococcus aureus: NEGATIVE

## 2022-03-03 NOTE — Patient Instructions (Addendum)
Your procedure is scheduled on: 03/15/22 - Wednesday Report to the Registration Desk on the 1st floor of the [facility]. To find out your arrival time, please call [phone] between 1PM - 3PM on: 03/14/22 - Tuesday If your arrival time is 6:00 am, do not arrive prior to that time as the [facility] entrance doors do not open until 6:00 am.  REMEMBER: Instructions that are not followed completely may result in serious medical risk, up to and including death; or upon the discretion of your surgeon and anesthesiologist your surgery may need to be rescheduled.  Do not eat food after midnight the night before surgery.  No gum chewing, lozengers or hard candies.  You may however, drink CLEAR liquids up to 2 hours before you are scheduled to arrive for your surgery. Do not drink anything within 2 hours of your scheduled arrival time.  Clear liquids include: - water  - apple juice without pulp - gatorade (not RED colors) - black coffee or tea (Do NOT add milk or creamers to the coffee or tea) Do NOT drink anything that is not on this list.  TAKE THESE MEDICATIONS THE MORNING OF SURGERY WITH A SIP OF WATER:  - omeprazole (PRILOSEC) 20 MG capsule, (take one the night before and one on the morning of surgery - helps to prevent nausea after surgery.) - DULoxetine (CYMBALTA) 60 MG  - gabapentin (NEURONTIN) 100 MG capsule  One week prior to surgery: Beginning 03/08/22 stop taking:  Stop Anti-inflammatories (NSAIDS) such as Advil, Aleve, Ibuprofen, Motrin, Naproxen, Naprosyn and Aspirin based products such as Excedrin, Goodys Powder, BC Powder.  Stop ANY OVER THE COUNTER supplements until after surgery.multivitamin-iron-minerals-folic acid.   You may however, continue to take Tylenol if needed for pain up until the day of surgery.  No Alcohol for 24 hours before or after surgery.  No Smoking including e-cigarettes for 24 hours prior to surgery.  No chewable tobacco products for at least  6 hours prior to surgery.  No nicotine patches on the day of surgery.  Do not use any "recreational" drugs for at least a week prior to your surgery.  Please be advised that the combination of cocaine and anesthesia may have negative outcomes, up to and including death. If you test positive for cocaine, your surgery will be cancelled.  On the morning of surgery brush your teeth with toothpaste and water, you may rinse your mouth with mouthwash if you wish. Do not swallow any toothpaste or mouthwash.  Use CHG Soap or wipes as directed on instruction sheet.  Do not wear jewelry, make-up, hairpins, clips or nail polish.  Do not wear lotions, powders, or perfumes.   Do not shave body from the neck down 48 hours prior to surgery just in case you cut yourself which could leave a site for infection.  Also, freshly shaved skin may become irritated if using the CHG soap.  Contact lenses, hearing aids and dentures may not be worn into surgery.  Do not bring valuables to the hospital. [facility] is not responsible for any missing/lost belongings or valuables.   Notify your doctor if there is any change in your medical condition (cold, fever, infection).  Wear comfortable clothing (specific to your surgery type) to the hospital.  After surgery, you can help prevent lung complications by doing breathing exercises.  Take deep breaths and cough every 1-2 hours. Your doctor may order a device called an Incentive Spirometer to help you take deep breaths. When  coughing or sneezing, hold a pillow firmly against your incision with both hands. This is called "splinting." Doing this helps protect your incision. It also decreases belly discomfort.  If you are being admitted to the hospital overnight, leave your suitcase in the car. After surgery it may be brought to your room.  If you are being discharged the day of surgery, you will not be allowed to drive home. You will need a responsible adult (18  years or older) to drive you home and stay with you that night.   If you are taking public transportation, you will need to have a responsible adult (18 years or older) with you. Please confirm with your physician that it is acceptable to use public transportation.   Please call the [facility] at [phone] if you have any questions about these instructions.  Surgery Visitation Policy:  Patients undergoing a surgery or procedure may have two family members or support persons with them as long as the person is not COVID-19 positive or experiencing its symptoms.   Inpatient Visitation:    Visiting hours are 7 a.m. to 8 p.m. Up to four visitors are allowed at one time in a patient room, including children. The visitors may rotate out with other people during the day. One designated support person (adult) may remain overnight.

## 2022-03-07 LAB — URINE CULTURE: Culture: NO GROWTH

## 2022-03-10 ENCOUNTER — Encounter
Admission: RE | Disposition: A | Discharge status: Home Health | Payer: Self-pay | Source: Ambulatory Visit | Attending: Internal Medicine

## 2022-03-10 ENCOUNTER — Observation Stay: Payer: Medicare Other

## 2022-03-10 ENCOUNTER — Other Ambulatory Visit: Payer: Self-pay

## 2022-03-10 ENCOUNTER — Ambulatory Visit: Payer: Medicare Other

## 2022-03-10 ENCOUNTER — Ambulatory Visit: Payer: Medicare Other | Admitting: Urgent Care

## 2022-03-10 ENCOUNTER — Inpatient Hospital Stay
Admission: RE | Admit: 2022-03-10 | Discharge: 2022-03-14 | DRG: 252 | Disposition: A | Discharge status: Home Health | Payer: Medicare Other | Source: Ambulatory Visit | Attending: Internal Medicine | Admitting: Internal Medicine

## 2022-03-10 ENCOUNTER — Encounter: Payer: Self-pay | Admitting: Neurosurgery

## 2022-03-10 DIAGNOSIS — Z7982 Long term (current) use of aspirin: Secondary | ICD-10-CM

## 2022-03-10 DIAGNOSIS — Z01812 Encounter for preprocedural laboratory examination: Secondary | ICD-10-CM

## 2022-03-10 DIAGNOSIS — E785 Hyperlipidemia, unspecified: Secondary | ICD-10-CM | POA: Diagnosis present

## 2022-03-10 DIAGNOSIS — D72829 Elevated white blood cell count, unspecified: Secondary | ICD-10-CM | POA: Diagnosis present

## 2022-03-10 DIAGNOSIS — E669 Obesity, unspecified: Secondary | ICD-10-CM | POA: Diagnosis present

## 2022-03-10 DIAGNOSIS — R8290 Unspecified abnormal findings in urine: Secondary | ICD-10-CM

## 2022-03-10 DIAGNOSIS — Z6836 Body mass index (BMI) 36.0-36.9, adult: Secondary | ICD-10-CM

## 2022-03-10 DIAGNOSIS — M5116 Intervertebral disc disorders with radiculopathy, lumbar region: Secondary | ICD-10-CM

## 2022-03-10 DIAGNOSIS — E876 Hypokalemia: Secondary | ICD-10-CM | POA: Diagnosis present

## 2022-03-10 DIAGNOSIS — J9601 Acute respiratory failure with hypoxia: Secondary | ICD-10-CM | POA: Diagnosis present

## 2022-03-10 DIAGNOSIS — R7401 Elevation of levels of liver transaminase levels: Secondary | ICD-10-CM | POA: Diagnosis present

## 2022-03-10 DIAGNOSIS — Z79899 Other long term (current) drug therapy: Secondary | ICD-10-CM

## 2022-03-10 DIAGNOSIS — I9581 Postprocedural hypotension: Principal | ICD-10-CM | POA: Diagnosis present

## 2022-03-10 DIAGNOSIS — Z888 Allergy status to other drugs, medicaments and biological substances status: Secondary | ICD-10-CM

## 2022-03-10 DIAGNOSIS — Z9689 Presence of other specified functional implants: Secondary | ICD-10-CM

## 2022-03-10 DIAGNOSIS — I951 Orthostatic hypotension: Secondary | ICD-10-CM | POA: Diagnosis present

## 2022-03-10 DIAGNOSIS — K219 Gastro-esophageal reflux disease without esophagitis: Secondary | ICD-10-CM | POA: Diagnosis present

## 2022-03-10 DIAGNOSIS — Z8673 Personal history of transient ischemic attack (TIA), and cerebral infarction without residual deficits: Secondary | ICD-10-CM

## 2022-03-10 DIAGNOSIS — G894 Chronic pain syndrome: Secondary | ICD-10-CM | POA: Diagnosis present

## 2022-03-10 DIAGNOSIS — G928 Other toxic encephalopathy: Secondary | ICD-10-CM | POA: Diagnosis present

## 2022-03-10 DIAGNOSIS — I10 Essential (primary) hypertension: Secondary | ICD-10-CM | POA: Diagnosis present

## 2022-03-10 HISTORY — PX: THORACIC LAMINECTOMY FOR SPINAL CORD STIMULATOR: SHX6887

## 2022-03-10 LAB — CBC WITH DIFFERENTIAL/PLATELET
Abs Immature Granulocytes: 0.08 10*3/uL — ABNORMAL HIGH (ref 0.00–0.07)
Basophils Absolute: 0.1 10*3/uL (ref 0.0–0.1)
Basophils Relative: 1 %
Eosinophils Absolute: 0.2 10*3/uL (ref 0.0–0.5)
Eosinophils Relative: 1 %
HCT: 38.2 % (ref 36.0–46.0)
Hemoglobin: 11.9 g/dL — ABNORMAL LOW (ref 12.0–15.0)
Immature Granulocytes: 1 %
Lymphocytes Relative: 9 %
Lymphs Abs: 1.3 10*3/uL (ref 0.7–4.0)
MCH: 29.3 pg (ref 26.0–34.0)
MCHC: 31.2 g/dL (ref 30.0–36.0)
MCV: 94.1 fL (ref 80.0–100.0)
Monocytes Absolute: 0.6 10*3/uL (ref 0.1–1.0)
Monocytes Relative: 5 %
Neutro Abs: 11.8 10*3/uL — ABNORMAL HIGH (ref 1.7–7.7)
Neutrophils Relative %: 83 %
Platelets: 264 10*3/uL (ref 150–400)
RBC: 4.06 MIL/uL (ref 3.87–5.11)
RDW: 13.8 % (ref 11.5–15.5)
WBC: 14 10*3/uL — ABNORMAL HIGH (ref 4.0–10.5)
nRBC: 0 % (ref 0.0–0.2)

## 2022-03-10 LAB — COMPREHENSIVE METABOLIC PANEL
ALT: 81 U/L — ABNORMAL HIGH (ref 0–44)
AST: 110 U/L — ABNORMAL HIGH (ref 15–41)
Albumin: 3.3 g/dL — ABNORMAL LOW (ref 3.5–5.0)
Alkaline Phosphatase: 92 U/L (ref 38–126)
Anion gap: 6 (ref 5–15)
BUN: 16 mg/dL (ref 8–23)
CO2: 27 mmol/L (ref 22–32)
Calcium: 8.3 mg/dL — ABNORMAL LOW (ref 8.9–10.3)
Chloride: 106 mmol/L (ref 98–111)
Creatinine, Ser: 0.72 mg/dL (ref 0.44–1.00)
GFR, Estimated: >60 mL/min (ref >60)
Glucose, Bld: 163 mg/dL — ABNORMAL HIGH (ref 70–99)
Potassium: 3.3 mmol/L — ABNORMAL LOW (ref 3.5–5.1)
Sodium: 139 mmol/L (ref 135–145)
Total Bilirubin: 0.6 mg/dL (ref 0.3–1.2)
Total Protein: 6 g/dL — ABNORMAL LOW (ref 6.5–8.1)

## 2022-03-10 LAB — MRSA NEXT GEN BY PCR, NASAL: MRSA by PCR Next Gen: NOT DETECTED

## 2022-03-10 LAB — PHOSPHORUS: Phosphorus: 3.6 mg/dL (ref 2.5–4.6)

## 2022-03-10 LAB — LACTIC ACID, PLASMA: Lactic Acid, Venous: 2.6 mmol/L (ref 0.5–1.9)

## 2022-03-10 LAB — MAGNESIUM: Magnesium: 2.1 mg/dL (ref 1.7–2.4)

## 2022-03-10 LAB — GLUCOSE, CAPILLARY: Glucose-Capillary: 132 mg/dL — ABNORMAL HIGH (ref 70–99)

## 2022-03-10 IMAGING — RF DG THORACIC SPINE 2V
1 series · 5 of 5 positions shown · non-contrast
Comparison: None Available.

CLINICAL DATA: Peri op.

EXAM:
THORACIC SPINE 2 VIEWS

[Series 1: dg x-ray · 0.20mm/px · 5 of 5 slices shown]
[im 1/5]
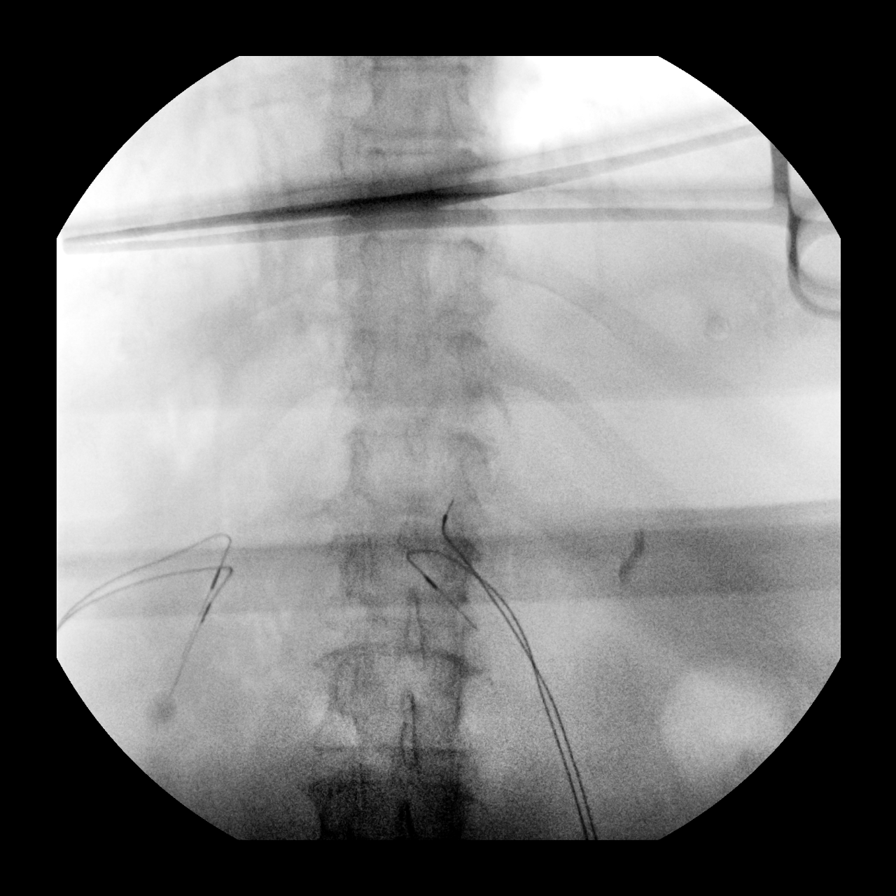
[im 2/5]
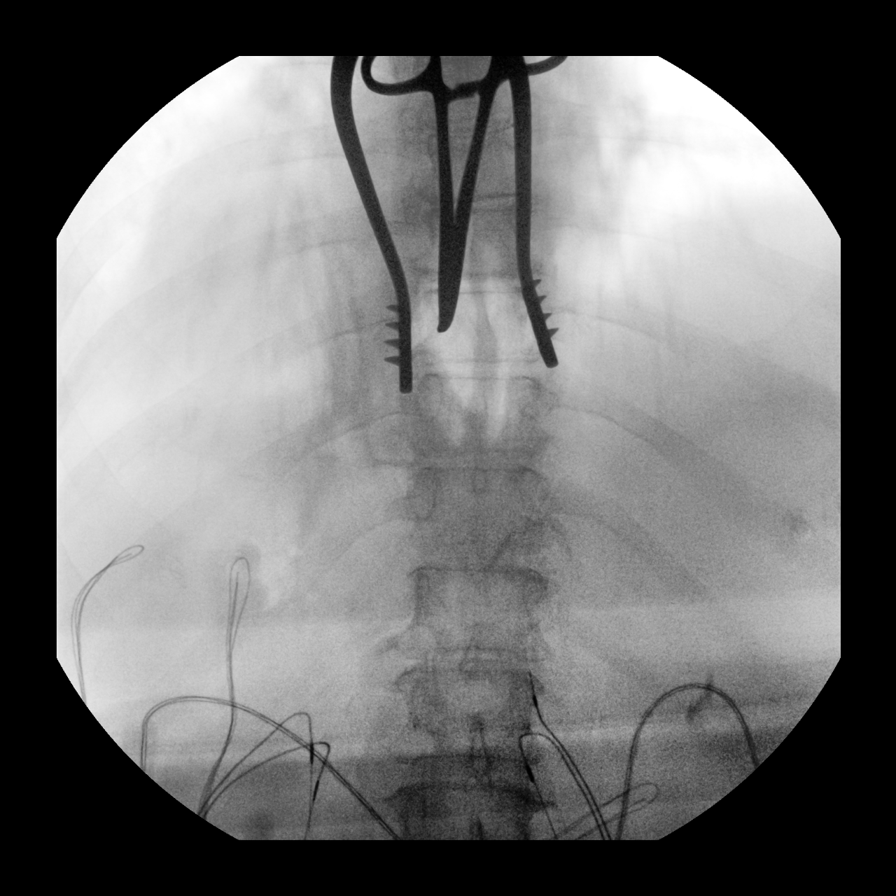
[im 3/5]
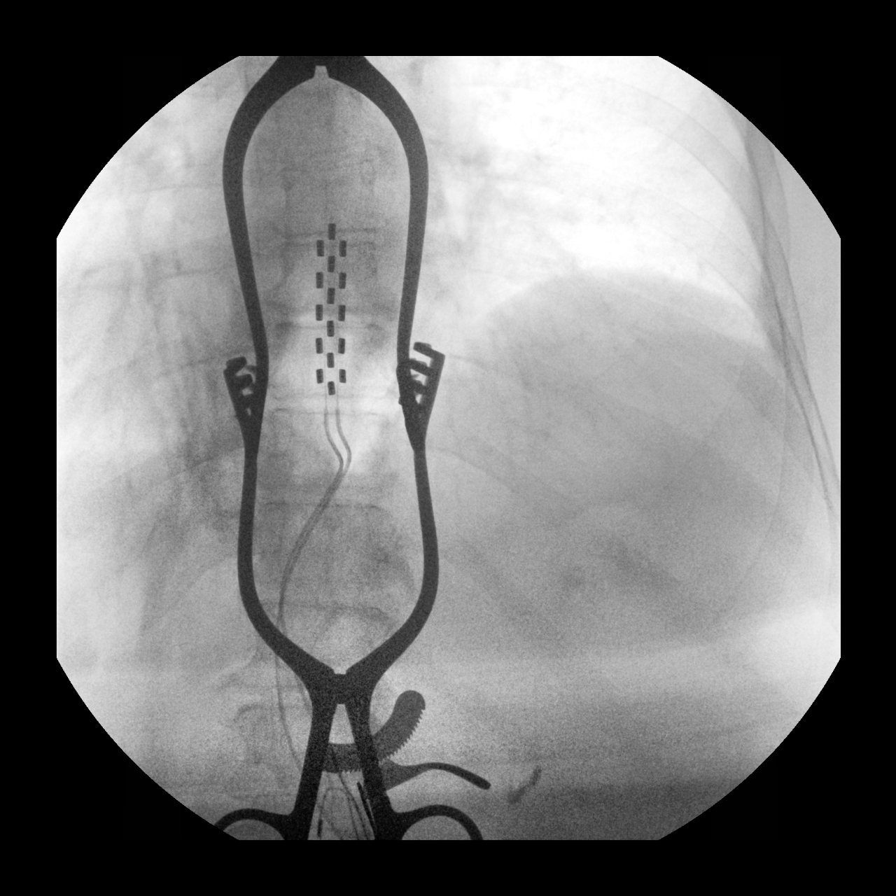
[im 4/5]
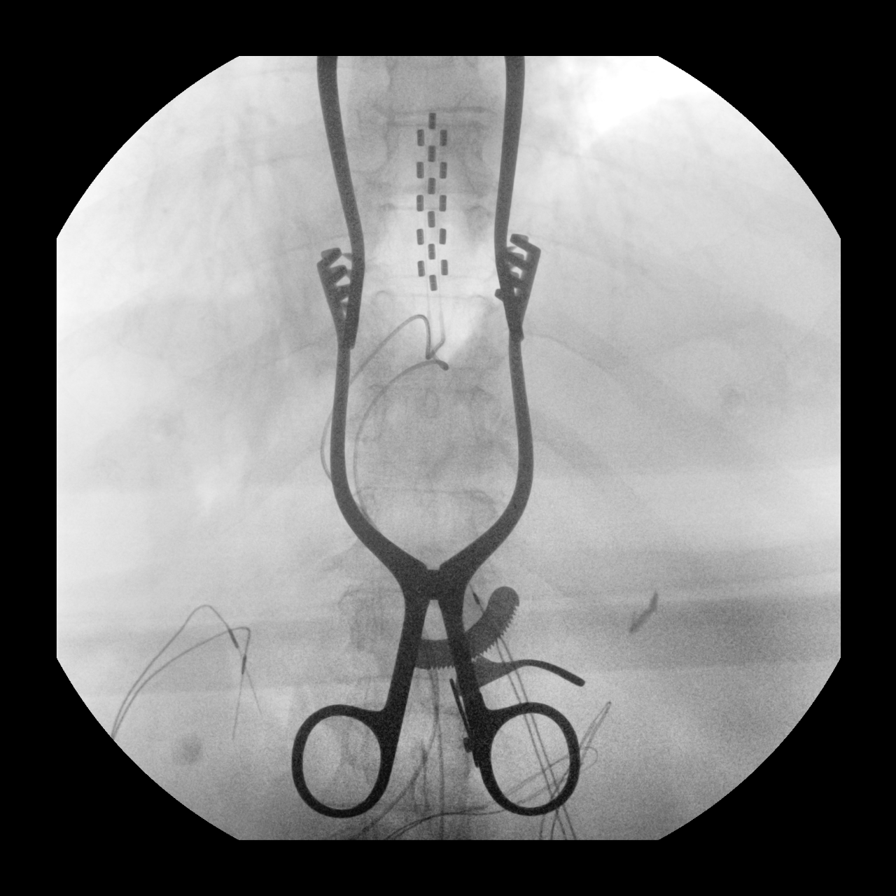
[im 5/5]
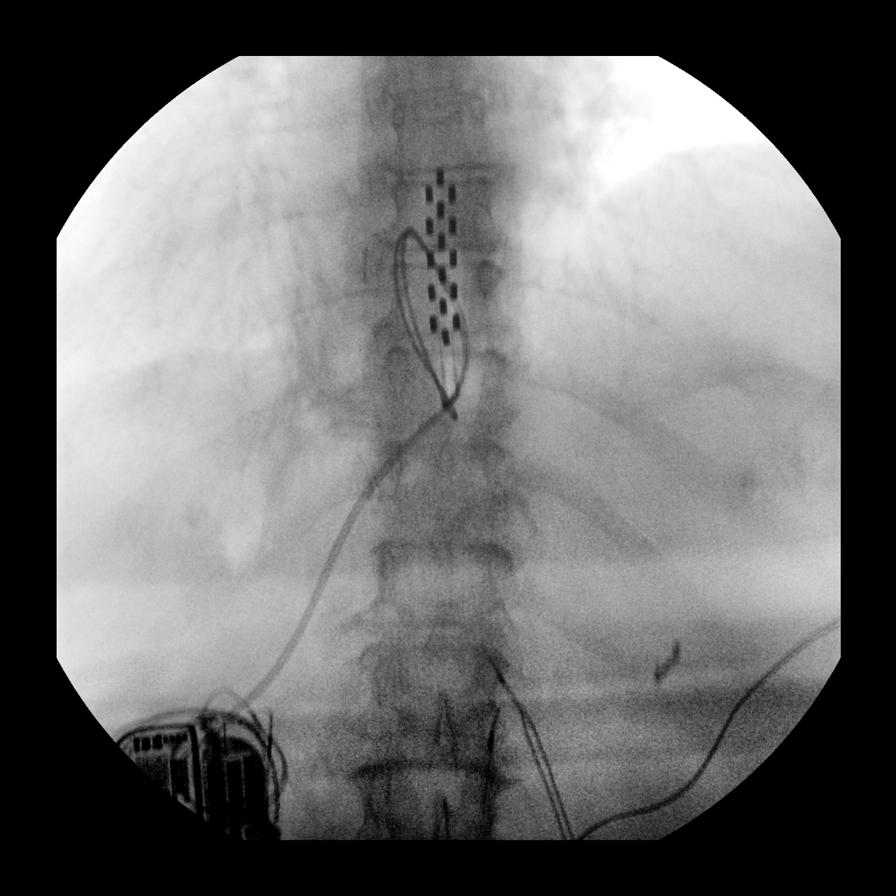

[5 of 5 positions shown; findings below may reference images not displayed]

FINDINGS: Fluoro time: 9 seconds.

Reported cumulative air kerma: 2.7 mGy.

Five C-arm fluoroscopic images were obtained intraoperatively and
submitted for post operative interpretation. These images
demonstrate placement of a thoracic spinal cord stimulator with
leads projecting midline. Please see the performing provider's
procedural report for further detail.
IMPRESSION: Intraoperative fluoroscopy, described above.

## 2022-03-10 IMAGING — DX DG CHEST 1V PORT
1 series · 1 of 1 positions shown · non-contrast
Comparison: None Available.

CLINICAL DATA: 65-year-old female with a history intubation

EXAM:
PORTABLE CHEST 1 VIEW

[chest ap]
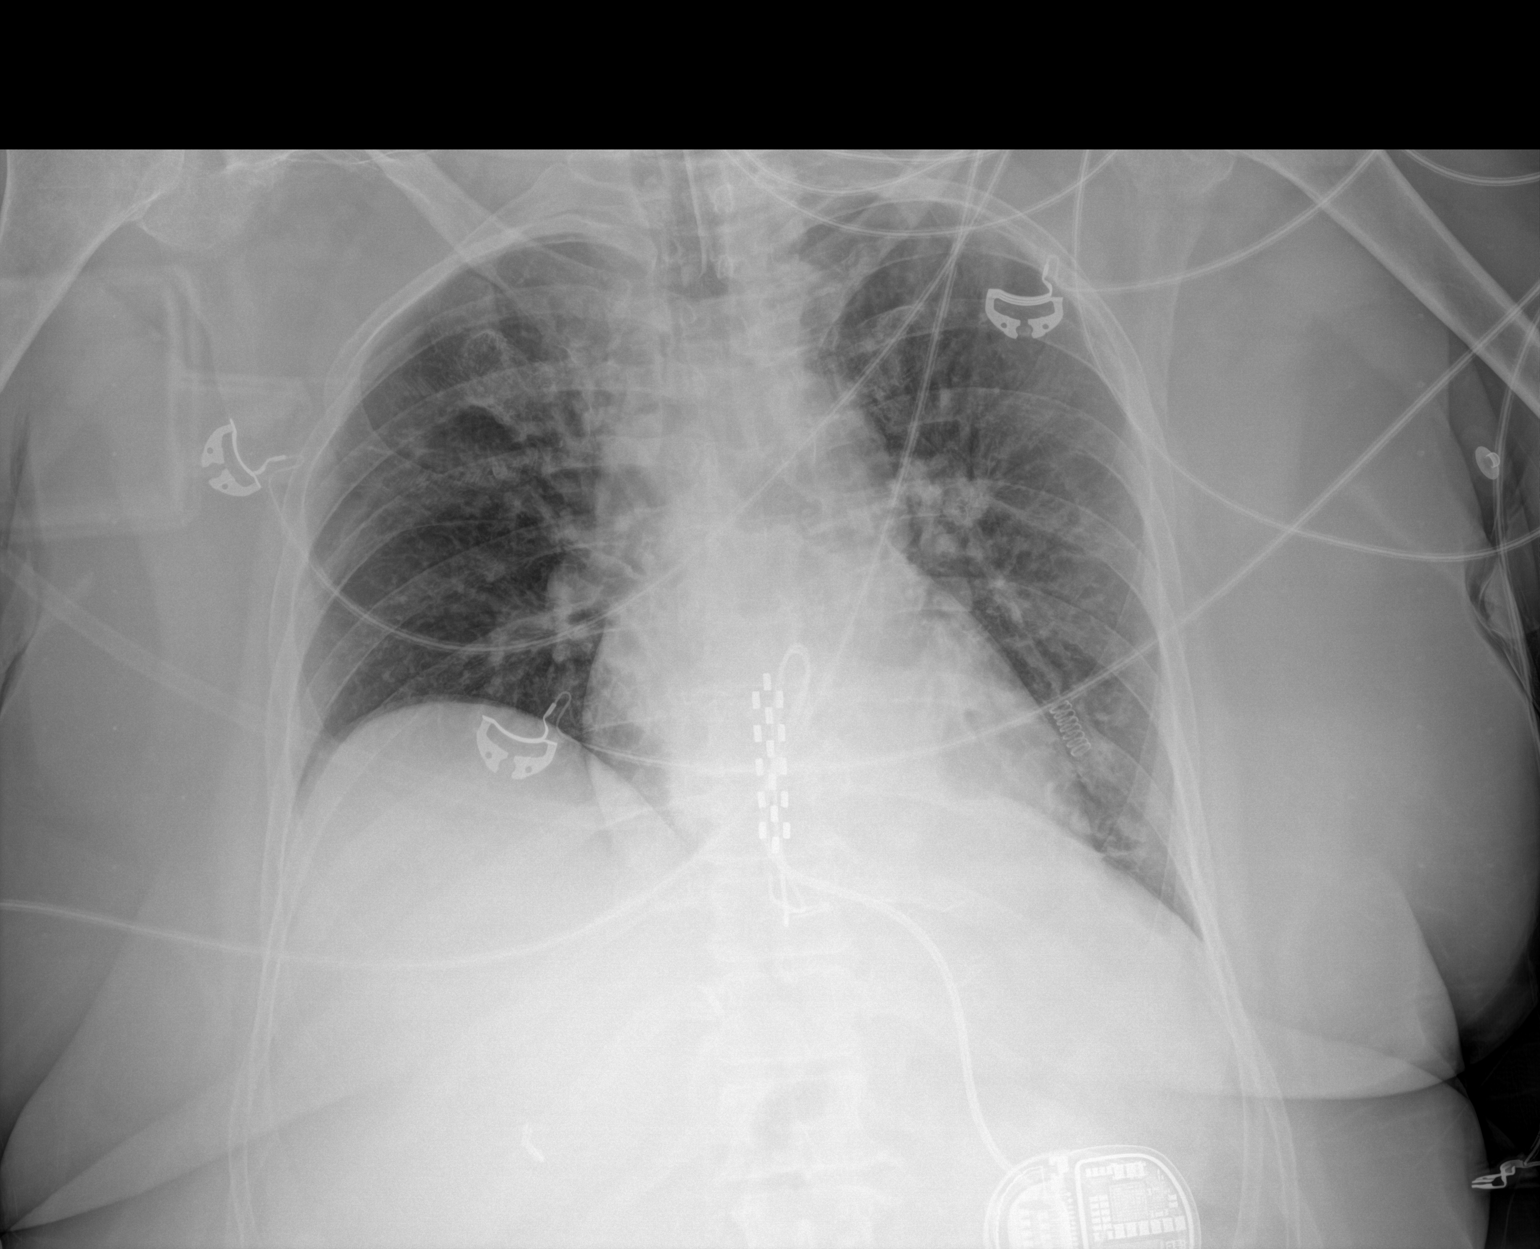

[1 of 1 positions shown; findings below may reference images not displayed]

FINDINGS: Cardiomediastinal silhouette borderline enlarged. Fullness in the
central vasculature, potentially secondary to low lung volumes.

Asymmetric elevation the right hemidiaphragm.

No pneumothorax or pleural effusion.

No confluent airspace disease.

Endotracheal tube terminates at the clavicular heads. 5.2 cm above
the carina.

Generator partially visualized overlying the abdomen.
IMPRESSION: Endotracheal tube terminates at the level of the clavicular heads,
5.2 cm above the carina.

Low lung volumes, with likely atelectasis

## 2022-03-10 SURGERY — THORACIC LAMINECTOMY FOR SPINAL CORD STIMULATOR
Anesthesia: General | Site: Spine Thoracic

## 2022-03-10 MED ORDER — ONDANSETRON HCL 4 MG/2ML IJ SOLN
4.0000 mg | Freq: Four times a day (QID) | INTRAMUSCULAR | Status: DC | PRN
  Administered 2022-03-10: 4 mg via INTRAVENOUS
  Filled 2022-03-10: qty 2

## 2022-03-10 MED ORDER — OXYCODONE HCL 5 MG PO TABS
10.0000 mg | ORAL_TABLET | ORAL | Status: DC | PRN
  Administered 2022-03-11 – 2022-03-13 (×6): 10 mg via ORAL
  Filled 2022-03-10 (×6): qty 2

## 2022-03-10 MED ORDER — VANCOMYCIN HCL IN DEXTROSE 1-5 GM/200ML-% IV SOLN
INTRAVENOUS | Status: AC
  Filled 2022-03-10: qty 200

## 2022-03-10 MED ORDER — PROPOFOL 10 MG/ML IV BOLUS
INTRAVENOUS | Status: DC | PRN
  Administered 2022-03-10: 200 mg via INTRAVENOUS

## 2022-03-10 MED ORDER — MIDAZOLAM HCL 2 MG/2ML IJ SOLN
INTRAMUSCULAR | Status: AC
  Filled 2022-03-10: qty 2

## 2022-03-10 MED ORDER — PROPOFOL 10 MG/ML IV BOLUS
INTRAVENOUS | Status: AC
  Filled 2022-03-10: qty 60

## 2022-03-10 MED ORDER — NOREPINEPHRINE 4 MG/250ML-% IV SOLN
INTRAVENOUS | Status: AC
  Filled 2022-03-10: qty 250

## 2022-03-10 MED ORDER — LACTATED RINGERS IV SOLN: INTRAVENOUS | Status: DC

## 2022-03-10 MED ORDER — AMLODIPINE BESYLATE 5 MG PO TABS: 5.0000 mg | ORAL_TABLET | Freq: Every day | ORAL | Status: DC

## 2022-03-10 MED ORDER — MORPHINE SULFATE (PF) 2 MG/ML IV SOLN
1.0000 mg | INTRAVENOUS | Status: AC | PRN
  Filled 2022-03-10: qty 1

## 2022-03-10 MED ORDER — CEFAZOLIN SODIUM-DEXTROSE 2-4 GM/100ML-% IV SOLN
INTRAVENOUS | Status: AC
  Filled 2022-03-10: qty 100

## 2022-03-10 MED ORDER — ADULT MULTIVITAMIN W/MINERALS CH
1.0000 | ORAL_TABLET | Freq: Every day | ORAL | Status: DC
Start: 2022-03-10 — End: 2022-03-14
  Administered 2022-03-11 – 2022-03-13 (×3): 1 via ORAL
  Filled 2022-03-10 (×4): qty 1

## 2022-03-10 MED ORDER — SENNA 8.6 MG PO TABS
1.0000 | ORAL_TABLET | Freq: Two times a day (BID) | ORAL | Status: DC
  Administered 2022-03-10 – 2022-03-13 (×6): 8.6 mg via ORAL
  Filled 2022-03-10 (×8): qty 1

## 2022-03-10 MED ORDER — BUPIVACAINE-EPINEPHRINE (PF) 0.5% -1:200000 IJ SOLN
INTRAMUSCULAR | Status: AC
  Filled 2022-03-10: qty 30

## 2022-03-10 MED ORDER — CHLORHEXIDINE GLUCONATE 0.12 % MT SOLN
OROMUCOSAL | Status: AC
  Filled 2022-03-10: qty 15

## 2022-03-10 MED ORDER — FENTANYL CITRATE (PF) 100 MCG/2ML IJ SOLN
INTRAMUSCULAR | Status: DC | PRN
  Administered 2022-03-10 (×2): 50 ug via INTRAVENOUS

## 2022-03-10 MED ORDER — METHOCARBAMOL 500 MG PO TABS
500.0000 mg | ORAL_TABLET | Freq: Four times a day (QID) | ORAL | Status: DC | PRN
  Administered 2022-03-13: 500 mg via ORAL
  Filled 2022-03-10: qty 1

## 2022-03-10 MED ORDER — SODIUM CHLORIDE 0.9% FLUSH
3.0000 mL | INTRAVENOUS | Status: DC | PRN
Start: 2022-03-10 — End: 2022-03-10

## 2022-03-10 MED ORDER — REMIFENTANIL HCL 1 MG IV SOLR
INTRAVENOUS | Status: AC
  Filled 2022-03-10: qty 1000

## 2022-03-10 MED ORDER — VANCOMYCIN HCL IN DEXTROSE 1-5 GM/200ML-% IV SOLN
1000.0000 mg | INTRAVENOUS | Status: AC
  Administered 2022-03-10: 1000 mg via INTRAVENOUS

## 2022-03-10 MED ORDER — SODIUM CHLORIDE 0.9 % IV SOLN
250.0000 mL | INTRAVENOUS | Status: DC
Start: 2022-03-10 — End: 2022-03-10

## 2022-03-10 MED ORDER — VASOPRESSIN 20 UNIT/ML IV SOLN
INTRAVENOUS | Status: DC | PRN
  Administered 2022-03-10: 4 [IU] via INTRAVENOUS
  Administered 2022-03-10: 2 [IU] via INTRAVENOUS
  Administered 2022-03-10: 6 [IU] via INTRAVENOUS
  Administered 2022-03-10: 2 [IU] via INTRAVENOUS
  Administered 2022-03-10: 8 [IU] via INTRAVENOUS
  Administered 2022-03-10 (×2): 2 [IU] via INTRAVENOUS

## 2022-03-10 MED ORDER — OXYCODONE HCL 5 MG PO TABS
5.0000 mg | ORAL_TABLET | ORAL | Status: DC | PRN
  Administered 2022-03-10 – 2022-03-13 (×4): 5 mg via ORAL
  Filled 2022-03-10 (×4): qty 1

## 2022-03-10 MED ORDER — EPHEDRINE SULFATE (PRESSORS) 50 MG/ML IJ SOLN
INTRAMUSCULAR | Status: DC | PRN
  Administered 2022-03-10 (×2): 10 mg via INTRAVENOUS

## 2022-03-10 MED ORDER — METHOCARBAMOL 1000 MG/10ML IJ SOLN: 500.0000 mg | Freq: Four times a day (QID) | INTRAVENOUS | Status: DC | PRN

## 2022-03-10 MED ORDER — OXYCODONE HCL 5 MG PO TABS: 5.0000 mg | ORAL_TABLET | Freq: Once | ORAL | Status: DC | PRN

## 2022-03-10 MED ORDER — KETOROLAC TROMETHAMINE 15 MG/ML IJ SOLN
7.5000 mg | Freq: Four times a day (QID) | INTRAMUSCULAR | Status: AC
  Administered 2022-03-10 – 2022-03-11 (×3): 7.5 mg via INTRAVENOUS
  Filled 2022-03-10 (×3): qty 1

## 2022-03-10 MED ORDER — POLYETHYLENE GLYCOL 3350 17 G PO PACK: 17.0000 g | PACK | Freq: Every day | ORAL | Status: DC

## 2022-03-10 MED ORDER — LIDOCAINE HCL (CARDIAC) PF 100 MG/5ML IV SOSY
PREFILLED_SYRINGE | INTRAVENOUS | Status: DC | PRN
  Administered 2022-03-10: 100 mg via INTRAVENOUS

## 2022-03-10 MED ORDER — PHENYLEPHRINE 80 MCG/ML (10ML) SYRINGE FOR IV PUSH (FOR BLOOD PRESSURE SUPPORT)
PREFILLED_SYRINGE | INTRAVENOUS | Status: DC | PRN
  Administered 2022-03-10: 80 ug via INTRAVENOUS
  Administered 2022-03-10: 160 ug via INTRAVENOUS
  Administered 2022-03-10 (×2): 240 ug via INTRAVENOUS
  Administered 2022-03-10 (×3): 160 ug via INTRAVENOUS

## 2022-03-10 MED ORDER — PHENYLEPHRINE HCL (PRESSORS) 10 MG/ML IV SOLN: INTRAVENOUS | Status: DC | PRN

## 2022-03-10 MED ORDER — FENTANYL CITRATE (PF) 100 MCG/2ML IJ SOLN: 25.0000 ug | INTRAMUSCULAR | Status: DC | PRN

## 2022-03-10 MED ORDER — SODIUM CHLORIDE 0.9 % IV SOLN: INTRAVENOUS | Status: DC | PRN

## 2022-03-10 MED ORDER — PHENOL 1.4 % MT LIQD: 1.0000 | OROMUCOSAL | Status: DC | PRN

## 2022-03-10 MED ORDER — ACETAMINOPHEN 500 MG PO TABS
1000.0000 mg | ORAL_TABLET | Freq: Four times a day (QID) | ORAL | Status: AC
  Administered 2022-03-10 – 2022-03-11 (×2): 1000 mg via ORAL
  Filled 2022-03-10 (×2): qty 2

## 2022-03-10 MED ORDER — OXYCODONE HCL 5 MG/5ML PO SOLN: 5.0000 mg | Freq: Once | ORAL | Status: DC | PRN

## 2022-03-10 MED ORDER — EPINEPHRINE 1 MG/10ML IJ SOSY
PREFILLED_SYRINGE | INTRAMUSCULAR | Status: DC | PRN
  Administered 2022-03-10: 5 ug via INTRAVENOUS
  Administered 2022-03-10 (×3): 10 ug via INTRAVENOUS
  Administered 2022-03-10: 30 ug via INTRAVENOUS
  Administered 2022-03-10: 10 ug via INTRAVENOUS

## 2022-03-10 MED ORDER — GABAPENTIN 300 MG PO CAPS
300.0000 mg | ORAL_CAPSULE | Freq: Two times a day (BID) | ORAL | Status: DC
  Administered 2022-03-10 – 2022-03-13 (×7): 300 mg via ORAL
  Filled 2022-03-10 (×8): qty 1

## 2022-03-10 MED ORDER — DEXAMETHASONE SODIUM PHOSPHATE 10 MG/ML IJ SOLN
INTRAMUSCULAR | Status: DC | PRN
  Administered 2022-03-10: 10 mg via INTRAVENOUS

## 2022-03-10 MED ORDER — ONDANSETRON HCL 4 MG PO TABS: 4.0000 mg | ORAL_TABLET | Freq: Four times a day (QID) | ORAL | Status: DC | PRN

## 2022-03-10 MED ORDER — ORAL CARE MOUTH RINSE
15.0000 mL | OROMUCOSAL | Status: DC
Start: 2022-03-10 — End: 2022-03-10
  Administered 2022-03-10 (×2): 15 mL via OROMUCOSAL

## 2022-03-10 MED ORDER — APREPITANT 40 MG PO CAPS
ORAL_CAPSULE | ORAL | Status: AC
  Filled 2022-03-10: qty 1

## 2022-03-10 MED ORDER — SUCCINYLCHOLINE CHLORIDE 200 MG/10ML IV SOSY
PREFILLED_SYRINGE | INTRAVENOUS | Status: DC | PRN
  Administered 2022-03-10: 100 mg via INTRAVENOUS

## 2022-03-10 MED ORDER — PROPOFOL 1000 MG/100ML IV EMUL
INTRAVENOUS | Status: AC
  Filled 2022-03-10: qty 300

## 2022-03-10 MED ORDER — SURGIFLO WITH THROMBIN (HEMOSTATIC MATRIX KIT) OPTIME
TOPICAL | Status: DC | PRN
  Administered 2022-03-10: 1 via TOPICAL

## 2022-03-10 MED ORDER — GLYCOPYRROLATE 0.2 MG/ML IJ SOLN
INTRAMUSCULAR | Status: DC | PRN
  Administered 2022-03-10: .2 mg via INTRAVENOUS

## 2022-03-10 MED ORDER — BUPIVACAINE LIPOSOME 1.3 % IJ SUSP
INTRAMUSCULAR | Status: AC
  Filled 2022-03-10: qty 20

## 2022-03-10 MED ORDER — SODIUM CHLORIDE 0.9% FLUSH
3.0000 mL | Freq: Two times a day (BID) | INTRAVENOUS | Status: DC
  Administered 2022-03-10: 3 mL via INTRAVENOUS

## 2022-03-10 MED ORDER — DULOXETINE HCL 30 MG PO CPEP
60.0000 mg | ORAL_CAPSULE | Freq: Every day | ORAL | Status: DC
  Administered 2022-03-10 – 2022-03-13 (×4): 60 mg via ORAL
  Filled 2022-03-10 (×5): qty 2

## 2022-03-10 MED ORDER — SODIUM CHLORIDE FLUSH 0.9 % IV SOLN
INTRAVENOUS | Status: AC
  Filled 2022-03-10: qty 20

## 2022-03-10 MED ORDER — ACETAMINOPHEN 10 MG/ML IV SOLN: 1000.0000 mg | Freq: Once | INTRAVENOUS | Status: DC | PRN

## 2022-03-10 MED ORDER — CEFAZOLIN SODIUM-DEXTROSE 2-4 GM/100ML-% IV SOLN
2.0000 g | INTRAVENOUS | Status: AC
Start: 2022-03-10 — End: 2022-03-10
  Administered 2022-03-10: 2 g via INTRAVENOUS

## 2022-03-10 MED ORDER — ACETAMINOPHEN 10 MG/ML IV SOLN
INTRAVENOUS | Status: DC | PRN
  Administered 2022-03-10: 1000 mg via INTRAVENOUS

## 2022-03-10 MED ORDER — ALUM & MAG HYDROXIDE-SIMETH 200-200-20 MG/5ML PO SUSP
30.0000 mL | Freq: Once | ORAL | Status: AC
  Administered 2022-03-10: 30 mL via ORAL
  Filled 2022-03-10: qty 30

## 2022-03-10 MED ORDER — PROPOFOL 500 MG/50ML IV EMUL
INTRAVENOUS | Status: DC | PRN
  Administered 2022-03-10: 145 ug/kg/min via INTRAVENOUS

## 2022-03-10 MED ORDER — MENTHOL 3 MG MT LOZG: 1.0000 | LOZENGE | OROMUCOSAL | Status: DC | PRN

## 2022-03-10 MED ORDER — LIDOCAINE VISCOUS HCL 2 % MT SOLN
15.0000 mL | Freq: Once | OROMUCOSAL | Status: AC
  Administered 2022-03-10: 15 mL via ORAL
  Filled 2022-03-10: qty 15

## 2022-03-10 MED ORDER — PHENYLEPHRINE HCL-NACL 20-0.9 MG/250ML-% IV SOLN
INTRAVENOUS | Status: DC | PRN
  Administered 2022-03-10: 25 ug/min via INTRAVENOUS

## 2022-03-10 MED ORDER — ORAL CARE MOUTH RINSE: 15.0000 mL | Freq: Once | OROMUCOSAL | Status: AC

## 2022-03-10 MED ORDER — NOREPINEPHRINE 4 MG/250ML-% IV SOLN
2.0000 ug/min | INTRAVENOUS | Status: DC
  Administered 2022-03-10: 3 ug/min via INTRAVENOUS

## 2022-03-10 MED ORDER — POLYETHYLENE GLYCOL 3350 17 G PO PACK: 17.0000 g | PACK | Freq: Every day | ORAL | Status: DC | PRN

## 2022-03-10 MED ORDER — MIDAZOLAM HCL 2 MG/2ML IJ SOLN
INTRAMUSCULAR | Status: DC | PRN
  Administered 2022-03-10: 2 mg via INTRAVENOUS
  Administered 2022-03-10: 1 mg via INTRAVENOUS

## 2022-03-10 MED ORDER — SUGAMMADEX SODIUM 200 MG/2ML IV SOLN: INTRAVENOUS | Status: DC | PRN

## 2022-03-10 MED ORDER — ONDANSETRON HCL 4 MG/2ML IJ SOLN: 4.0000 mg | Freq: Once | INTRAMUSCULAR | Status: DC | PRN

## 2022-03-10 MED ORDER — ACETAMINOPHEN 10 MG/ML IV SOLN
INTRAVENOUS | Status: AC
  Filled 2022-03-10: qty 100

## 2022-03-10 MED ORDER — DOCUSATE SODIUM 50 MG/5ML PO LIQD: 100.0000 mg | Freq: Two times a day (BID) | ORAL | Status: DC

## 2022-03-10 MED ORDER — FAMOTIDINE IN NACL 20-0.9 MG/50ML-% IV SOLN
20.0000 mg | Freq: Two times a day (BID) | INTRAVENOUS | Status: DC
  Administered 2022-03-10 – 2022-03-11 (×2): 20 mg via INTRAVENOUS
  Filled 2022-03-10 (×3): qty 50

## 2022-03-10 MED ORDER — ENOXAPARIN SODIUM 60 MG/0.6ML IJ SOSY
0.5000 mg/kg | PREFILLED_SYRINGE | INTRAMUSCULAR | Status: DC
  Administered 2022-03-11 – 2022-03-14 (×4): 47.5 mg via SUBCUTANEOUS
  Filled 2022-03-10: qty 0.6
  Filled 2022-03-10: qty 0.47
  Filled 2022-03-10: qty 0.6
  Filled 2022-03-10: qty 0.47
  Filled 2022-03-10: qty 0.6

## 2022-03-10 MED ORDER — ORAL CARE MOUTH RINSE: 15.0000 mL | OROMUCOSAL | Status: DC | PRN

## 2022-03-10 MED ORDER — APREPITANT 40 MG PO CAPS
40.0000 mg | ORAL_CAPSULE | Freq: Once | ORAL | Status: AC
  Administered 2022-03-10: 40 mg via ORAL

## 2022-03-10 MED ORDER — DOCUSATE SODIUM 100 MG PO CAPS
100.0000 mg | ORAL_CAPSULE | Freq: Two times a day (BID) | ORAL | Status: DC
  Administered 2022-03-10 – 2022-03-13 (×5): 100 mg via ORAL
  Filled 2022-03-10 (×7): qty 1

## 2022-03-10 MED ORDER — BUPIVACAINE HCL (PF) 0.5 % IJ SOLN
INTRAMUSCULAR | Status: AC
Start: 2022-03-10 — End: ?
  Filled 2022-03-10: qty 30

## 2022-03-10 MED ORDER — FENTANYL CITRATE (PF) 100 MCG/2ML IJ SOLN
INTRAMUSCULAR | Status: AC
  Filled 2022-03-10: qty 2

## 2022-03-10 MED ORDER — 0.9 % SODIUM CHLORIDE (POUR BTL) OPTIME
TOPICAL | Status: DC | PRN
  Administered 2022-03-10: 500 mL

## 2022-03-10 MED ORDER — POTASSIUM CHLORIDE CRYS ER 20 MEQ PO TBCR
40.0000 meq | EXTENDED_RELEASE_TABLET | Freq: Once | ORAL | Status: AC
  Administered 2022-03-10: 40 meq via ORAL
  Filled 2022-03-10: qty 2

## 2022-03-10 MED ORDER — CHLORHEXIDINE GLUCONATE CLOTH 2 % EX PADS
6.0000 | MEDICATED_PAD | Freq: Every day | CUTANEOUS | Status: DC
Start: 2022-03-10 — End: 2022-03-14
  Administered 2022-03-10 – 2022-03-14 (×3): 6 via TOPICAL

## 2022-03-10 MED ORDER — ATORVASTATIN CALCIUM 20 MG PO TABS
20.0000 mg | ORAL_TABLET | Freq: Every evening | ORAL | Status: DC
  Administered 2022-03-11 – 2022-03-13 (×3): 20 mg via ORAL
  Filled 2022-03-10 (×3): qty 1

## 2022-03-10 MED ORDER — SODIUM CHLORIDE (PF) 0.9 % IJ SOLN
INTRAMUSCULAR | Status: DC | PRN
  Administered 2022-03-10: 60 mL via INTRAMUSCULAR

## 2022-03-10 MED ORDER — LOSARTAN POTASSIUM 50 MG PO TABS
50.0000 mg | ORAL_TABLET | Freq: Every day | ORAL | Status: DC
Start: 2022-03-10 — End: 2022-03-10

## 2022-03-10 MED ORDER — FLEET ENEMA 7-19 GM/118ML RE ENEM: 1.0000 | ENEMA | Freq: Once | RECTAL | Status: DC | PRN

## 2022-03-10 MED ORDER — PANTOPRAZOLE SODIUM 40 MG PO TBEC
40.0000 mg | DELAYED_RELEASE_TABLET | Freq: Every day | ORAL | Status: DC
  Administered 2022-03-11 – 2022-03-13 (×3): 40 mg via ORAL
  Filled 2022-03-10 (×4): qty 1

## 2022-03-10 MED ORDER — BUPIVACAINE-EPINEPHRINE (PF) 0.5% -1:200000 IJ SOLN
INTRAMUSCULAR | Status: DC | PRN
  Administered 2022-03-10: 10 mL

## 2022-03-10 MED ORDER — ONDANSETRON HCL 4 MG/2ML IJ SOLN
INTRAMUSCULAR | Status: DC | PRN
  Administered 2022-03-10: 4 mg via INTRAVENOUS

## 2022-03-10 MED ORDER — ROCURONIUM BROMIDE 100 MG/10ML IV SOLN: INTRAVENOUS | Status: DC | PRN

## 2022-03-10 MED ORDER — CHLORHEXIDINE GLUCONATE 0.12 % MT SOLN
15.0000 mL | Freq: Once | OROMUCOSAL | Status: AC
  Administered 2022-03-10: 15 mL via OROMUCOSAL

## 2022-03-10 MED ORDER — SODIUM CHLORIDE 0.9 % IV SOLN: INTRAVENOUS | Status: DC

## 2022-03-10 MED ORDER — TRAZODONE HCL 50 MG PO TABS
50.0000 mg | ORAL_TABLET | Freq: Every evening | ORAL | Status: DC | PRN
  Administered 2022-03-10 – 2022-03-11 (×2): 50 mg via ORAL
  Filled 2022-03-10 (×2): qty 1

## 2022-03-10 MED ORDER — BISACODYL 10 MG RE SUPP
10.0000 mg | Freq: Every day | RECTAL | Status: DC | PRN
  Administered 2022-03-13: 10 mg via RECTAL
  Filled 2022-03-10: qty 1

## 2022-03-10 MED ORDER — SODIUM CHLORIDE 0.9 % IV SOLN: 250.0000 mL | INTRAVENOUS | Status: DC

## 2022-03-10 SURGICAL SUPPLY — 58 items
ADH SKN CLS APL DERMABOND .7 (GAUZE/BANDAGES/DRESSINGS) ×2
APL PRP STRL LF DISP 70% ISPRP (MISCELLANEOUS) ×2
BUR NEURO DRILL SOFT 3.0X3.8M (BURR) ×2 IMPLANT
CHLORAPREP W/TINT 26 (MISCELLANEOUS) ×4 IMPLANT
CONTROLLER INTELLIS PTM PAPER (NEUROSURGERY SUPPLIES) ×1 IMPLANT
COUNTER NEEDLE 20/40 LG (NEEDLE) ×2 IMPLANT
COVER LIGHT HANDLE STERIS (MISCELLANEOUS) ×4 IMPLANT
CUP MEDICINE 2OZ PLAST GRAD ST (MISCELLANEOUS) ×2 IMPLANT
DERMABOND ADVANCED (GAUZE/BANDAGES/DRESSINGS) ×4
DERMABOND ADVANCED .7 DNX12 (GAUZE/BANDAGES/DRESSINGS) ×2 IMPLANT
DEVICE IMPLANT NEUROSTIMULATOR (Neuro Prosthesis/Implant) ×1 IMPLANT
DRAPE C ARM PK CFD 31 SPINE (DRAPES) ×2 IMPLANT
DRAPE LAPAROTOMY 100X77 ABD (DRAPES) ×2 IMPLANT
DRAPE SURG 17X11 SM STRL (DRAPES) ×2 IMPLANT
DRSG OPSITE POSTOP 4X6 (GAUZE/BANDAGES/DRESSINGS) ×3 IMPLANT
ELECT CAUTERY BLADE TIP 2.5 (TIP) ×2
ELECT REM PT RETURN 9FT ADLT (ELECTROSURGICAL) ×2
ELECTRODE CAUTERY BLDE TIP 2.5 (TIP) ×1 IMPLANT
ELECTRODE REM PT RTRN 9FT ADLT (ELECTROSURGICAL) ×1 IMPLANT
FEE INTRAOP CADWELL SUPPLY NCS (MISCELLANEOUS) IMPLANT
FEE INTRAOP MONITOR IMPULS NCS (MISCELLANEOUS) IMPLANT
GLOVE BIOGEL PI IND STRL 6.5 (GLOVE) ×1 IMPLANT
GLOVE BIOGEL PI INDICATOR 6.5 (GLOVE) ×2
GLOVE SURG SYN 6.5 ES PF (GLOVE) ×2 IMPLANT
GLOVE SURG SYN 6.5 PF PI (GLOVE) ×1 IMPLANT
GLOVE SURG SYN 8.5  E (GLOVE) ×6
GLOVE SURG SYN 8.5 E (GLOVE) ×3 IMPLANT
GLOVE SURG SYN 8.5 PF PI (GLOVE) ×3 IMPLANT
GOWN SRG LRG LVL 4 IMPRV REINF (GOWNS) ×1 IMPLANT
GOWN SRG XL LVL 3 NONREINFORCE (GOWNS) ×1 IMPLANT
GOWN STRL NON-REIN TWL XL LVL3 (GOWNS) ×2
GOWN STRL REIN LRG LVL4 (GOWNS) ×2
GRADUATE 1200CC STRL 31836 (MISCELLANEOUS) ×2 IMPLANT
INTRAOP CADWELL SUPPLY FEE NCS (MISCELLANEOUS) ×1
INTRAOP DISP SUPPLY FEE NCS (MISCELLANEOUS) ×2
INTRAOP MONITOR FEE IMPULS NCS (MISCELLANEOUS) ×1
INTRAOP MONITOR FEE IMPULSE (MISCELLANEOUS) ×2
KIT SPINAL PRONEVIEW (KITS) ×2 IMPLANT
KIT TURNOVER KIT A (KITS) ×2 IMPLANT
MANIFOLD NEPTUNE II (INSTRUMENTS) ×2 IMPLANT
MARKER SKIN DUAL TIP RULER LAB (MISCELLANEOUS) ×4 IMPLANT
NDL SAFETY ECLIPSE 18X1.5 (NEEDLE) ×1 IMPLANT
NEEDLE HYPO 18GX1.5 SHARP (NEEDLE) ×2
NEEDLE HYPO 22GX1.5 SAFETY (NEEDLE) ×2 IMPLANT
NS IRRIG 500ML POUR BTL (IV SOLUTION) ×1 IMPLANT
PACK LAMINECTOMY NEURO (CUSTOM PROCEDURE TRAY) ×2 IMPLANT
RECHARGER INTELLIS (NEUROSURGERY SUPPLIES) ×1 IMPLANT
STAPLER SKIN PROX 35W (STAPLE) ×2 IMPLANT
STIMULATOR CORD SURESCAN MRI (Stimulator) ×1 IMPLANT
SUT SILK 2 0SH CR/8 30 (SUTURE) ×2 IMPLANT
SUT V-LOC 90 ABS DVC 3-0 CL (SUTURE) ×2 IMPLANT
SUT VIC AB 0 CT1 18XCR BRD 8 (SUTURE) ×1 IMPLANT
SUT VIC AB 0 CT1 8-18 (SUTURE) ×4
SUT VIC AB 2-0 CT1 18 (SUTURE) ×3 IMPLANT
SYR 10ML LL (SYRINGE) ×2 IMPLANT
SYR 30ML LL (SYRINGE) ×4 IMPLANT
TOWEL OR 17X26 4PK STRL BLUE (TOWEL DISPOSABLE) ×6 IMPLANT
TUBING CONNECTING 10 (TUBING) ×2 IMPLANT

## 2022-03-10 NOTE — Consult Note (Signed)
NAME:  Tammy Stephens, MRN:  [id number], DOB:  12-01-1955, LOS: 0 ADMISSION DATE:  03/10/2022, CONSULTATION DATE: 03/10/2022 REFERRING MD: Dr. Cari Caraway, CHIEF COMPLAINT:Hypotension    History of Present Illness:  This is a 66 yo female with a hx of chronic pain syndrome and complication of anesthesia (difficulty waking up).  She presented to [facility] on 06/16 for elective placement of a spinal cord stimulator per neurosurgery.  Postop pt remained mechanically intubated due to encephalopathy and developed hypotension requiring vasopressors.  PCCM team contacted to assist with management.   Pertinent  Medical History  Arthritis  Cataract  Complication of Anesthesia Depression  GERD Kidney Stones HLD HTN Stroke   Significant Hospital Events: Including procedures, antibiotic start and stop dates in addition to other pertinent events   06/16: Pt admitted to ICU post elective spinal stimulator placement for chronic pain syndrome remained mechanically intubated postop due to acute encephalopathy and also developed hypotension postop   Interim History / Subjective:  As outlined above   Objective   Blood pressure (!) 128/95, pulse 98, temperature 98.8 F (37.1 C), temperature source Oral, resp. rate 16, height '5\' 2"'  (1.575 m), weight 90.7 kg, SpO2 96 %.        Intake/Output Summary (Last 24 hours) at 03/10/2022 0932 Last data filed at 03/10/2022 8916 Gross per 24 hour  Intake 1500 ml  Output 50 ml  Net 1450 ml   Filed Weights   03/10/22 0626  Weight: 90.7 kg    Examination: General: Acutely ill appearing female, NAD mechanically intubated  HENT: Supple, no JVD  Lungs: Faint rhonchi throughout, even, non labored  Cardiovascular: NSR. rrr, no r/g, 2+ radial/1+ distal pulses, no edema  Abdomen: +BS x4, obese, soft, non distended  Extremities: Normal bulk and tone Neuro: Sedated, not following commands or withdrawing from painful stimulation  GU: Indwelling foley catheter to be  placed for now   Resolved Hospital Problem list     Assessment & Plan:  Mechanical intubation for airway protection preoperatively and postop - Full vent support for now: vent settings reviewed and established - Wean PEEP and FiO2 as able to maintain O2 sats >92% - Goal plateau pressure less than 30, driving pressure less than 15 - SBT once all parameters met  - VAP prevention order set  Hypotension suspect secondary to sedating medications  - Continuous telemetry monitoring  - Aggressive iv fluid resuscitation and prn vasopressors to maintain map >65 - Wean sedating medications as able   Acute encephalopathy postop secondary to sedating medication vs. possible acute CVA  Mechanical intubation pain/discomfort  Chronic pain syndrome s/p spinal cord stimulator placement 03/10/22 - Neurosurgery consulted appreciate input: neuro checks per recommendations  - Maintain RASS goal 0  - Will hold sedating medications to perform SBT once pt awake and able to follow commands - If mentation does not improve will place order for MRI Brain per neurosurgery recommendations   Best Practice (right click and "Reselect all SmartList Selections" daily)  Diet/type: NPO DVT prophylaxis: SCD GI prophylaxis: H2B Lines: N/A Foley:  Yes, and it is still needed Code Status:  full code Last date of multidisciplinary goals of care discussion [03/10/2022]  Labs   CBC: Recent Labs  Lab 03/03/22 1330  WBC 8.3  HGB 13.2  HCT 42.6  MCV 94.7  PLT 945    Basic Metabolic Panel: Recent Labs  Lab 03/03/22 1330  NA 140  K 3.5  CL 102  CO2 31  GLUCOSE 91  BUN  12  CREATININE 0.83  CALCIUM 8.9   GFR: Estimated Creatinine Clearance: 68.8 mL/min (by C-G formula based on SCr of 0.83 mg/dL). Recent Labs  Lab 03/03/22 1330  WBC 8.3    Liver Function Tests: No results for input(s): "AST", "ALT", "ALKPHOS", "BILITOT", "PROT", "ALBUMIN" in the last 168 hours. No results for input(s): "LIPASE",  "AMYLASE" in the last 168 hours. No results for input(s): "AMMONIA" in the last 168 hours.  ABG No results found for: "PHART", "PCO2ART", "PO2ART", "HCO3", "TCO2", "ACIDBASEDEF", "O2SAT"   Coagulation Profile: No results for input(s): "INR", "PROTIME" in the last 168 hours.  Cardiac Enzymes: No results for input(s): "CKTOTAL", "CKMB", "CKMBINDEX", "TROPONINI" in the last 168 hours.  HbA1C: No results found for: "HGBA1C"  CBG: No results for input(s): "GLUCAP" in the last 168 hours.  Review of Systems:   Unable to assess pt mechanically intubated   Past Medical History:  She,  has a past medical history of Arthritis, Cataract, Complication of anesthesia, Depression, GERD (gastroesophageal reflux disease), History of kidney stones, Hyperlipidemia, Hypertension, and Stroke ([place]) (2016).   Surgical History:   Past Surgical History:  Procedure Laterality Date   ABDOMINAL HYSTERECTOMY     APPENDECTOMY     CHOLECYSTECTOMY     COLONOSCOPY     DILATION AND CURETTAGE OF UTERUS     ESOPHAGOGASTRODUODENOSCOPY     HIP SURGERY Left    JOINT REPLACEMENT     left hip - done in    TONSILLECTOMY       Social History:   reports that she has never smoked. She has never used smokeless tobacco. She reports that she does not drink alcohol and does not use drugs.   Family History:  Her family history is not on file.   Allergies No Known Allergies   Home Medications  Prior to Admission medications   Medication Sig Start Date End Date Taking? Authorizing Provider  amLODipine (NORVASC) 5 MG tablet Take 5 mg by mouth daily.   Yes [provider]  aspirin EC 81 MG tablet Take 81 mg by mouth daily. Swallow whole.   Yes [provider]  DULoxetine (CYMBALTA) 60 MG capsule Take 60 mg by mouth daily.   Yes [provider]  gabapentin (NEURONTIN) 100 MG capsule Take 300 mg by mouth 2 (two) times daily.   Yes [provider]  losartan (COZAAR) 50 MG tablet  Take 50 mg by mouth daily.   Yes [provider]  multivitamin-iron-minerals-folic acid (CENTRUM) chewable tablet Chew 1 tablet by mouth daily.   Yes [provider]  omeprazole (PRILOSEC) 20 MG capsule Take 20 mg by mouth daily.   Yes [provider]  atorvastatin (LIPITOR) 20 MG tablet Take 20 mg by mouth daily.    [provider]  b complex vitamins capsule Take 1 capsule by mouth daily. Patient not taking: Reported on 01/05/2022    [provider]  cholecalciferol (VITAMIN D3) 25 MCG (1000 UNIT) tablet Take 1,000 Units by mouth daily. Patient not taking: Reported on 01/05/2022    [provider]  cyclobenzaprine (FLEXERIL) 5 MG tablet Take 1 tablet (5 mg total) by mouth 3 (three) times daily as needed. Patient not taking: Reported on 01/05/2022 01/22/21   Menshew, Dannielle Karvonen, PA-C  ketorolac (TORADOL) 10 MG tablet Take 1 tablet (10 mg total) by mouth every 8 (eight) hours. Patient not taking: Reported on 03/03/2022 01/22/21   Menshew, Dannielle Karvonen, PA-C  nortriptyline (PAMELOR) 25 MG  capsule Take 25 mg by mouth at bedtime. Patient not taking: Reported on 01/05/2022    [provider]     Critical care time: 40 minutes       Donell Beers, [place] Pager [phone] (please enter 7 digits) PCCM Consult Pager [phone] (please enter 7 digits)

## 2022-03-10 NOTE — Anesthesia Procedure Notes (Addendum)
Procedure Name: Intubation Date/Time: 03/10/2022 7:24 AM  Performed by: Mohammed Kindle, CRNAPre-anesthesia Checklist: Patient identified, Emergency Drugs available, Suction available and Patient being monitored Patient Re-evaluated:Patient Re-evaluated prior to induction Oxygen Delivery Method: Circle system utilized Preoxygenation: Pre-oxygenation with 100% oxygen Induction Type: IV induction Ventilation: Mask ventilation without difficulty Laryngoscope Size: McGraph and 3 Grade View: Grade I Tube type: Oral Tube size: 6.5 mm Number of attempts: 1 Airway Equipment and Method: Stylet and Oral airway Placement Confirmation: ETT inserted through vocal cords under direct vision, positive ETCO2, breath sounds checked- equal and bilateral and CO2 detector Secured at: 21 cm Tube secured with: Tape Dental Injury: Teeth and Oropharynx as per pre-operative assessment

## 2022-03-10 NOTE — Anesthesia Preprocedure Evaluation (Addendum)
Anesthesia Evaluation  Patient identified by MRN, date of birth, ID band Patient awake    Reviewed: Allergy & Precautions, NPO status , Patient's Chart, lab work & pertinent test results  History of Anesthesia Complications Negative for: history of anesthetic complications  Airway Mallampati: III   Neck ROM: Full    Dental no notable dental hx.    Pulmonary neg pulmonary ROS,    Pulmonary exam normal breath sounds clear to auscultation       Cardiovascular hypertension, Normal cardiovascular exam Rhythm:Regular Rate:Normal  ECG 03/03/22: normal   Neuro/Psych Chronic pain CVA (2016)    GI/Hepatic GERD  ,  Endo/Other  Obesity   Renal/GU Renal disease (nephrolithiasis)     Musculoskeletal  (+) Arthritis ,   Abdominal   Peds  Hematology negative hematology ROS (+)   Anesthesia Other Findings   Reproductive/Obstetrics                            Anesthesia Physical Anesthesia Plan  ASA: 3  Anesthesia Plan: General   Post-op Pain Management:    Induction: Intravenous  PONV Risk Score and Plan: 3 and Ondansetron, Dexamethasone and Treatment may vary due to age or medical condition  Airway Management Planned: Oral ETT  Additional Equipment:   Intra-op Plan:   Post-operative Plan: Extubation in OR  Informed Consent: I have reviewed the patients History and Physical, chart, labs and discussed the procedure including the risks, benefits and alternatives for the proposed anesthesia with the patient or authorized representative who has indicated his/her understanding and acceptance.     Dental advisory given  Plan Discussed with: CRNA  Anesthesia Plan Comments: (Patient consented for risks of anesthesia including but not limited to:  - adverse reactions to medications - damage to eyes, teeth, lips or other oral mucosa - nerve damage due to positioning  - sore throat or  hoarseness - damage to heart, brain, nerves, lungs, other parts of body or loss of life  Informed patient about role of CRNA in peri- and intra-operative care.  Patient voiced understanding.)        Anesthesia Quick Evaluation

## 2022-03-10 NOTE — Progress Notes (Signed)
PHARMACY -  BRIEF ANTIBIOTIC NOTE   Pharmacy has received consult(s) for Cefazolin & Vancomycin from an OR provider.  The patient's profile has been reviewed for ht/wt/allergies/indication/available labs.    One time order(s) placed for Cefazolin 2 gm and Vancomycin 1 gm per pt wt of 90.7 kg from 02/13/22  Further antibiotics/pharmacy consults should be ordered by admitting physician if indicated.                       Otelia Sergeant, PharmD, [facility] 03/10/2022 6:28 AM

## 2022-03-10 NOTE — Anesthesia Postprocedure Evaluation (Signed)
Anesthesia Post Note  Patient: Tammy Stephens  Procedure(s) Performed: THORACIC LAMINECTOMY FOR SPINAL CORD STIMULATOR IMPLANTATION ([employer]) (Spine Thoracic)  Patient location during evaluation: ICU Anesthesia Type: General Level of consciousness: awake and alert, oriented and patient cooperative Pain management: pain level controlled Vital Signs Assessment: post-procedure vital signs reviewed and stable Respiratory status: spontaneous breathing, nonlabored ventilation and respiratory function stable Cardiovascular status: blood pressure returned to baseline and stable Postop Assessment: adequate PO intake Anesthetic complications: yes Comments: Patient transported intubated to ICU for lack of respiratory effort and hypotension postop requiring pressors.  Both issues resolved quickly and pt was extubated and off pressors at the time of this note.  Recommended pt get cardiac workup to assess reason behind blood pressure instability; pt and family member at bedside understand and agree.   Encounter Notable Events  Notable Event Outcome Phase Comment  Delayed emergence > 1 hour Resolved in Lab Intraprocedure No respiratory efforts at conclusion of case, kept pt intubated and transported to ICU.  Hypotension Resolved in Lab In Recovery Required pressors for short period postop.     Last Vitals:  Vitals:   03/10/22 1000 03/10/22 1007  BP: (!) 96/39 (!) 123/55  Pulse: 80 77  Resp: 17 17  Temp:  (!) 35.3 C  SpO2: 100% 99%    Last Pain:  Vitals:   03/10/22 1007  TempSrc: Rectal  PainSc:                  Reed Breech

## 2022-03-10 NOTE — Op Note (Signed)
Indications: the patient is a 66 yo female who was diagnosed with chronic pain syndrome g89.4. The patient had a successful trial for spinal cord stimulation, so was consented for placement of a permanent device   Findings: successful placement of a Med[employer] spinal cord stimulator.   Preoperative Diagnosis: chronic pain syndrome g89.4 Postoperative Diagnosis: same     EBL: 50 ml IVF: see AR ml Drains: none Disposition: Extubated and Stable to PACU Complications: none   No foley catheter was placed.     Preoperative Note:    Risks of surgery discussed in clinic.   Operative Note:      The patient was then brought from the preoperative center with intravenous access established.  The patient underwent general anesthesia and endotracheal tube intubation, then was rotated on the [facility] table where all pressure points were appropriately padded.  An incision was marked with flouroscopy at T8/9, and on the left flank. The skin was then thoroughly cleansed.  Perioperative antibiotic prophylaxis was administered.  Sterile prep and drapes were then applied and a timeout was then observed.     Once this was complete an incision was opened with the use of a #10 blade knife in the midline at the thoracic incision.  The paraspinus muscled were subperiosteally dissected until the laminae of T9 and T10 were visualized. Flouroscopy was used to confirm the level. A self-retaining retractor was placed.   The rongeur was used to remove the spinous process of T9.  The drill was used to thin the bone until the ligamentum flavum was visualized.  The ligamentum was then removed and the dura visualized. This was widened until placement of the paddle lead was possible.     The lead was then advanced to the T7/8 disc space at the top of the lead.  The lead was secured with a 2-0 silk suture.     The incision on the flank was then opened and a pocket formed until it was large enough for the pulse generator.   The tunneler was used to connect between the pocket and the incision.  The lead was inserted into the tunneler and tunneled to the buttock.  The leads were attached to the IPG and impedances checked.  The leads were then tightened.  The IPG was then inserted into the pouch.   Both sites were irrigated.  The wounds were closed in layers with 0 and 2-0 vicryl.  The skin was approximated with monocryl. A sterile dressing was applied.   Monitoring was stable throughout.   Patient was then rotated back to the preoperative bed awakened from anesthesia and taken to recovery all counts are correct in this case.   I performed the entire procedure with the assistance of Manning Charity PA as an Designer, television/film set.     Venetia Night MD

## 2022-03-10 NOTE — Progress Notes (Signed)
An USGPIV (ultrasound guided PIV) has been placed to LAFA 20g 1.25"for short-term vasopressor infusion. A correctly placed ivWatch must be used when administering Vasopressors. Should this treatment be needed beyond 72 hours, central line access should be obtained.  It will be the responsibility of the bedside nurse to follow best practice to prevent extravasations.

## 2022-03-10 NOTE — Discharge Instructions (Signed)
NEUROSURGERY DISCHARGE INSTRUCTIONS  Admission diagnosis: Spinal cord stimulator status [Z96.89]  Operative procedure: Placement of spinal cord stimulator  What to do after you leave the hospital:  Recommended diet: regular diet. Increase protein intake to promote wound healing.  Recommended activity: no lifting, driving, or strenuous exercise for 4 weeks . You should walk multiple times per day  Special Instructions  No straining, no heavy lifting > 10lbs x 4 weeks.  Keep incision area clean and dry. May shower in 2 days. No baths or pools for 6 weeks.  Please remove dressing tomorrow, no need to apply a bandage afterwards  You have no sutures to remove, the skin is closed with adhesive  Please take pain medications as directed. Take a stool softener if on pain medications   Please Report any of the following: Nausea or Vomiting, Temperature is greater than 101.29F (38.1C) degrees, Dizziness, Abdominal Pain, Difficulty Breathing or Shortness of Breath, Inability to Eat, drink Fluids, or Take medications, Bleeding, swelling, or drainage from surgical incision sites, New numbness or weakness, and Bowel or bladder dysfunction to the neurosurgeon on call at [phone]  Additional Follow up appointments Please follow up with Manning Charity PA-C in [place] clinic as scheduled in 2-3 weeks   Please see below for scheduled appointments:  No future appointments.

## 2022-03-10 NOTE — Transfer of Care (Signed)
Immediate Anesthesia Transfer of Care Note  Patient: Tammy Stephens  Procedure(s) Performed: THORACIC LAMINECTOMY FOR SPINAL CORD STIMULATOR IMPLANTATION (MEDTRONIC) (Spine Thoracic)  Patient Location: ICU  Anesthesia Type:General  Level of Consciousness: patient cooperative, lethargic and responds to stimulation  Airway & Oxygen Therapy: Patient remains intubated per anesthesia plan and Patient placed on Ventilator (see vital sign flow sheet for setting)  Post-op Assessment: Report given to RN and Post -op Vital signs reviewed and stable  Post vital signs: Reviewed and stable  Last Vitals:  Vitals Value Taken Time  BP 96/39 03/10/22 1000  Temp    Pulse 75 03/10/22 1001  Resp 21 03/10/22 1001  SpO2 100 % 03/10/22 1001  Vitals shown include unvalidated device data.  Last Pain:  Vitals:   03/10/22 0626  TempSrc: Oral  PainSc: 0-No pain      Patients Stated Pain Goal: 0 (03/10/22 0626)  Complications: No notable events documented.

## 2022-03-10 NOTE — H&P (Signed)
I have reviewed and confirmed my history and physical from 02/21/22 with no additions or changes. Plan for Spinal cord stimulator implantation.  Risks and benefits reviewed.  Heart sounds normal no MRG. Chest Clear to Auscultation Bilaterally.

## 2022-03-11 DIAGNOSIS — J9601 Acute respiratory failure with hypoxia: Secondary | ICD-10-CM | POA: Diagnosis present

## 2022-03-11 DIAGNOSIS — Z79899 Other long term (current) drug therapy: Secondary | ICD-10-CM | POA: Diagnosis not present

## 2022-03-11 DIAGNOSIS — I951 Orthostatic hypotension: Secondary | ICD-10-CM | POA: Diagnosis not present

## 2022-03-11 DIAGNOSIS — G928 Other toxic encephalopathy: Secondary | ICD-10-CM | POA: Diagnosis present

## 2022-03-11 DIAGNOSIS — E876 Hypokalemia: Secondary | ICD-10-CM | POA: Diagnosis present

## 2022-03-11 DIAGNOSIS — K219 Gastro-esophageal reflux disease without esophagitis: Secondary | ICD-10-CM | POA: Diagnosis present

## 2022-03-11 DIAGNOSIS — E785 Hyperlipidemia, unspecified: Secondary | ICD-10-CM | POA: Diagnosis present

## 2022-03-11 DIAGNOSIS — R7401 Elevation of levels of liver transaminase levels: Secondary | ICD-10-CM | POA: Diagnosis present

## 2022-03-11 DIAGNOSIS — Z8673 Personal history of transient ischemic attack (TIA), and cerebral infarction without residual deficits: Secondary | ICD-10-CM | POA: Diagnosis not present

## 2022-03-11 DIAGNOSIS — I10 Essential (primary) hypertension: Secondary | ICD-10-CM | POA: Diagnosis present

## 2022-03-11 DIAGNOSIS — Z888 Allergy status to other drugs, medicaments and biological substances status: Secondary | ICD-10-CM | POA: Diagnosis not present

## 2022-03-11 DIAGNOSIS — Z7982 Long term (current) use of aspirin: Secondary | ICD-10-CM | POA: Diagnosis not present

## 2022-03-11 DIAGNOSIS — Z9689 Presence of other specified functional implants: Secondary | ICD-10-CM | POA: Diagnosis not present

## 2022-03-11 DIAGNOSIS — I9581 Postprocedural hypotension: Secondary | ICD-10-CM | POA: Diagnosis present

## 2022-03-11 DIAGNOSIS — E669 Obesity, unspecified: Secondary | ICD-10-CM | POA: Diagnosis present

## 2022-03-11 DIAGNOSIS — Z6836 Body mass index (BMI) 36.0-36.9, adult: Secondary | ICD-10-CM | POA: Diagnosis not present

## 2022-03-11 DIAGNOSIS — G894 Chronic pain syndrome: Secondary | ICD-10-CM | POA: Diagnosis present

## 2022-03-11 DIAGNOSIS — D72829 Elevated white blood cell count, unspecified: Secondary | ICD-10-CM | POA: Diagnosis present

## 2022-03-11 LAB — CBC
HCT: 36.2 % (ref 36.0–46.0)
Hemoglobin: 11.1 g/dL — ABNORMAL LOW (ref 12.0–15.0)
MCH: 28.5 pg (ref 26.0–34.0)
MCHC: 30.7 g/dL (ref 30.0–36.0)
MCV: 93.1 fL (ref 80.0–100.0)
Platelets: 225 10*3/uL (ref 150–400)
RBC: 3.89 MIL/uL (ref 3.87–5.11)
RDW: 13.3 % (ref 11.5–15.5)
WBC: 19.8 10*3/uL — ABNORMAL HIGH (ref 4.0–10.5)
nRBC: 0 % (ref 0.0–0.2)

## 2022-03-11 LAB — BASIC METABOLIC PANEL
Anion gap: 6 (ref 5–15)
BUN: 11 mg/dL (ref 8–23)
CO2: 27 mmol/L (ref 22–32)
Calcium: 8.7 mg/dL — ABNORMAL LOW (ref 8.9–10.3)
Chloride: 103 mmol/L (ref 98–111)
Creatinine, Ser: 0.8 mg/dL (ref 0.44–1.00)
GFR, Estimated: >60 mL/min (ref >60)
Glucose, Bld: 144 mg/dL — ABNORMAL HIGH (ref 70–99)
Potassium: 4.5 mmol/L (ref 3.5–5.1)
Sodium: 136 mmol/L (ref 135–145)

## 2022-03-11 NOTE — Evaluation (Signed)
Occupational Therapy Evaluation Patient Details Name: Tammy Stephens MRN: [id number] DOB: 1956-05-21 Today's Date: 03/11/2022   History of Present Illness Pt is a 66 y/o F admitted on 03/10/22. Pt has been diagnosed with chronic pain syndrome & had successful trial for spinal cord stimulator so underwent placement of permanent device by Dr. Myer Haff on 03/10/22. Postop pt remained mechanically intubated due to encephalopathy and developed hypotension requiring vasopressors. PMH: arthritis, cataracts, depression, GERD, kidney stones, HLD, HTN, stroke   Clinical Impression   Pt was seen for OT evaluation this date. Prior to hospital admission, pt was active and independent. She endorses living alone, caring for her small dog Tammy Stephens. Pt lives alone in a 1 level, condo. Currently pt demonstrates impairments as described below (See OT problem list) which functionally limit her ability to perform ADL/self-care tasks. Pt currently requires SUPERVISION for bed mobility/functional mobility during session. She endorses increased difficulty with holding drinks and foot items.  Pt would benefit from skilled OT services to address noted impairments and functional limitations (see below for any additional details) in order to maximize safety and independence while minimizing falls risk and caregiver burden. Do not anticipate the need for follow-up OT services upon hospital DC.    Recommendations for follow up therapy are one component of a multi-disciplinary discharge planning process, led by the attending physician.  Recommendations may be updated based on patient status, additional functional criteria and insurance authorization.   Follow Up Recommendations  No OT follow up    Assistance Recommended at Discharge Set up Supervision/Assistance  Patient can return home with the following      Functional Status Assessment     Equipment Recommendations  None recommended by OT    Recommendations for Other  Services       Precautions / Restrictions Precautions Precautions: Fall;Back Restrictions Weight Bearing Restrictions: No      Mobility Bed Mobility Overal bed mobility: Needs Assistance Bed Mobility: Sidelying to Sit   Sidelying to sit: Supervision, HOB elevated       General bed mobility comments: cuing for log rolling    Transfers Overall transfer level: Needs assistance Equipment used: Rolling walker (2 wheels) Transfers: Sit to/from Stand, Bed to chair/wheelchair/BSC Sit to Stand: Min assist     Step pivot transfers: Supervision     General transfer comment: cuing for safe hand placement during STS      Balance Overall balance assessment: Needs assistance Sitting-balance support: Feet supported, Bilateral upper extremity supported Sitting balance-Leahy Scale: Good Sitting balance - Comments: steady static sitting reaching inside BOS.   Standing balance support: During functional activity, Bilateral upper extremity supported, Reliant on assistive device for balance, Single extremity supported Standing balance-Leahy Scale: Fair Standing balance comment: Increased dizziness with standing .                           ADL either performed or assessed with clinical judgement   ADL Overall ADL's : Needs assistance/impaired                                       General ADL Comments: Pt requires SUPERVISION for bed/functional mobility. She is able to independently reach her feet to simulate LB dressing while sitting EOB. Anticipate SUPERVISION for LB bathing and dressing.     Vision Patient Visual Report: No change from baseline  Perception     Praxis      Pertinent Vitals/Pain Pain Assessment Pain Assessment: 0-10 Pain Score: 5  Pain Location: headache; low back pain Pain Descriptors / Indicators: Discomfort, Sore Pain Intervention(s): Limited activity within patient's tolerance, Monitored during session, Repositioned      Hand Dominance Right   Extremity/Trunk Assessment Upper Extremity Assessment Upper Extremity Assessment: Generalized weakness   Lower Extremity Assessment Lower Extremity Assessment: Generalized weakness       Communication Communication Communication: No difficulties   Cognition Arousal/Alertness: Awake/alert Behavior During Therapy: WFL for tasks assessed/performed, Anxious Overall Cognitive Status: Within Functional Limits for tasks assessed                                 General Comments: Pt reports multiple times she is anxious about her post-op complications. She states, "I'm afraid I'll go to sleep and not wake up again."     General Comments  BP monitored t/o session Pt initially 116/79 semi-supine, 104/86 once seated EOB, BP improves to 125/77 with ~5 min sitting EOB, and 101/72 standing at the EOB. Pt reported moderate dizziness in standing. Pt returned semi-supine and RN notified.    Exercises Other Exercises Other Exercises: Pt educated on role of OT in acute setting, safe use of AE/DME for ADL management, and routines modifications to support safety and functional independence during ADL management.   Shoulder Instructions      Home Living Family/patient expects to be discharged to:: Private residence Living Arrangements: Alone Available Help at Discharge: Family;Available PRN/intermittently Type of Home: House (condo) Home Access: Stairs to enter [employer] of Steps: 3 Entrance Stairs-Rails: Right;Left;Can reach both Home Layout: One level               Home Equipment: Cane - single point          Prior Functioning/Environment Prior Level of Function : Independent/Modified Independent;Driving             Mobility Comments: Pt was ambulatory with SPC, endorses 6-7 falls in past 6 months (reports chronic issues with LLE following hip sx years ago), recently retired from teaching ADLs Comments: Pt endorses  managing all ADLs independently. She endorses driving, caring for her small dog, "Tammy Stephens".        OT Problem List: Decreased strength;Decreased range of motion;Decreased safety awareness;Decreased activity tolerance;Impaired balance (sitting and/or standing);Decreased knowledge of use of DME or AE;Pain      OT Treatment/Interventions: Self-care/ADL training;Therapeutic exercise;Therapeutic activities;DME and/or AE instruction;Patient/family education;Balance training;Energy conservation    OT Goals(Current goals can be found in the care plan section) Acute Rehab OT Goals Patient Stated Goal: To feel better OT Goal Formulation: With patient Time For Goal Achievement: 03/25/22 Potential to Achieve Goals: Good ADL Goals Pt Will Perform Grooming: sitting;with modified independence Pt Will Perform Lower Body Dressing: sit to/from stand;with modified independence;with adaptive equipment Pt Will Transfer to Toilet: with modified independence;regular height toilet;ambulating Pt Will Perform Toileting - Clothing Manipulation and hygiene: sit to/from stand;with modified independence  OT Frequency: Min 2X/week    Co-evaluation              AM-PAC OT "6 Clicks" Daily Activity     Outcome Measure Help from another person eating meals?: None Help from another person taking care of personal grooming?: A Little Help from another person toileting, which includes using toliet, bedpan, or urinal?: A Little Help from another person bathing (including  washing, rinsing, drying)?: A Little Help from another person to put on and taking off regular upper body clothing?: A Little Help from another person to put on and taking off regular lower body clothing?: A Little 6 Click Score: 19   End of Session Equipment Utilized During Treatment: Gait belt;Rolling walker (2 wheels) Nurse Communication: Mobility status  Activity Tolerance: Patient tolerated treatment well Patient left: in bed;with call  bell/phone within reach;with bed alarm set  OT Visit Diagnosis: Other abnormalities of gait and mobility (R26.89)                Time: 6160-7371 OT Time Calculation (min): 25 min Charges:  OT General Charges $OT Visit: 1 Visit OT Evaluation $OT Eval Moderate Complexity: 1 Mod OT Treatments $Self Care/Home Management : 8-22 mins  Rockney Ghee, M.S., OTR/L Ascom: [phone] 03/11/22, 3:56 PM

## 2022-03-11 NOTE — Progress Notes (Addendum)
PROGRESS NOTE    Tammy Stephens  IDP:[id number] DOB: Jan 06, 1956 DOA: 03/10/2022 PCP: Default, Provider, MD   Assessment & Plan:   Principal Problem:   Spinal cord stimulator status Active Problems:   Pre-operative laboratory examination  Assessment and Plan:  Acute hypoxic respiratory failure: intubation, ventilated & extubated, Currently on RA. Pt had difficulty waking up after surg on 03/10/22  Hypotension: likely secondary to sedating medications & orthostatic hypotension. Required vasopressors but since has been weaned off  Acute encephalopathy: likely secondary to above. Resolved    Chronic pain syndrome: s/p spinal cord stimulator placement on 03/10/22 as per neuro surg. Po pain meds only  Hypokalemia: WNL today    Leukocytosis: likely reactive. Will continue to monitor   Transaminitis: etiology unclear. Will continue to monitor        DVT prophylaxis: lovenox  Code Status: full  Family Communication: discussed pt's care w/ pt's daughter at bedside and answered her questions  Disposition Plan: likely d/c back home   Level of care:med-surg  Status is: Observation     Consultants:  Neuro surg  ICU  Procedures:   Antimicrobials:    Subjective: Pt c/o anxiety   Objective: Vitals:   03/11/22 1200 03/11/22 1300 03/11/22 1400 03/11/22 1500  BP: 99/84 120/71  112/72  Pulse: (!) 101 96 (!) 57 84  Resp:      Temp:      TempSrc:      SpO2: 93% (!) 87% 92% 98%  Weight:      Height:        Intake/Output Summary (Last 24 hours) at 03/11/2022 1621 Last data filed at 03/11/2022 0500 Gross per 24 hour  Intake 85.53 ml  Output 1250 ml  Net -1164.47 ml   Filed Weights   03/10/22 0626 03/10/22 1000  Weight: 90.7 kg 95.6 kg    Examination:  General exam: Appears calm and comfortable  Respiratory system: Clear to auscultation. Respiratory effort normal. Cardiovascular system: S1 & S2+. No  rubs, gallops or clicks. Gastrointestinal system: Abdomen  is nondistended, soft and nontender.  Normal bowel sounds heard. Central nervous system: Alert and oriented. Moves all extremities  Psychiatry: Judgement and insight appear normal. Anxious mood and affect      Data Reviewed: I have personally reviewed following labs and imaging studies  CBC: Recent Labs  Lab 03/10/22 1028 03/11/22 0850  WBC 14.0* 19.8*  NEUTROABS 11.8*  --   HGB 11.9* 11.1*  HCT 38.2 36.2  MCV 94.1 93.1  PLT 264 225   Basic Metabolic Panel: Recent Labs  Lab 03/10/22 1142 03/11/22 0850  NA 139 136  K 3.3* 4.5  CL 106 103  CO2 27 27  GLUCOSE 163* 144*  BUN 16 11  CREATININE 0.72 0.80  CALCIUM 8.3* 8.7*  MG 2.1  --   PHOS 3.6  --    GFR: Estimated Creatinine Clearance: 73.6 mL/min (by C-G formula based on SCr of 0.8 mg/dL). Liver Function Tests: Recent Labs  Lab 03/10/22 1142  AST 110*  ALT 81*  ALKPHOS 92  BILITOT 0.6  PROT 6.0*  ALBUMIN 3.3*   No results for input(s): "LIPASE", "AMYLASE" in the last 168 hours. No results for input(s): "AMMONIA" in the last 168 hours. Coagulation Profile: No results for input(s): "INR", "PROTIME" in the last 168 hours. Cardiac Enzymes: No results for input(s): "CKTOTAL", "CKMB", "CKMBINDEX", "TROPONINI" in the last 168 hours. BNP (last 3 results) No results for input(s): "PROBNP" in the last 8760 hours. HbA1C: No  results for input(s): "HGBA1C" in the last 72 hours. CBG: Recent Labs  Lab 03/10/22 0955  GLUCAP 132*   Lipid Profile: No results for input(s): "CHOL", "HDL", "LDLCALC", "TRIG", "CHOLHDL", "LDLDIRECT" in the last 72 hours. Thyroid Function Tests: No results for input(s): "TSH", "T4TOTAL", "FREET4", "T3FREE", "THYROIDAB" in the last 72 hours. Anemia Panel: No results for input(s): "VITAMINB12", "FOLATE", "FERRITIN", "TIBC", "IRON", "RETICCTPCT" in the last 72 hours. Sepsis Labs: Recent Labs  Lab 03/10/22 1028  LATICACIDVEN 2.6*    Recent Results (from the past 240 hour(s))  Surgical  pcr screen     Status: None   Collection Time: 03/03/22  1:30 PM   Specimen: Nasal Mucosa; Nasal Swab  Result Value Ref Range Status   MRSA, PCR NEGATIVE NEGATIVE Final   Staphylococcus aureus NEGATIVE NEGATIVE Final    Comment: (NOTE) The Xpert SA Assay (FDA approved for NASAL specimens in patients 89 years of age and older), is one component of a comprehensive surveillance program. It is not intended to diagnose infection nor to guide or monitor treatment. Performed at [facility], [address] [place], [place] [address]   Urine Culture     Status: None   Collection Time: 03/03/22  1:30 PM   Specimen: Urine, Clean Catch  Result Value Ref Range Status   Specimen Description   Final    URINE, CLEAN CATCH Performed at [facility], [address], [place], [place] [address]    Special Requests   Final    NONE Performed at [facility], [address] [place], [place] [address]    Culture   Final    NO GROWTH Performed at [facility], [address], [place], [place] [address]    Report Status 03/07/2022 FINAL  Final  MRSA Next Gen by PCR, Nasal     Status: None   Collection Time: 03/10/22  9:56 AM   Specimen: Nasal Mucosa; Nasal Swab  Result Value Ref Range Status   MRSA by PCR Next Gen NOT DETECTED NOT DETECTED Final    Comment: (NOTE) The GeneXpert MRSA Assay (FDA approved for NASAL specimens only), is one component of a comprehensive MRSA colonization surveillance program. It is not intended to diagnose MRSA infection nor to guide or monitor treatment for MRSA infections. Test performance is not FDA approved in patients less than 48 years old. Performed at [facility], [address] [place], [place] [address]          Radiology Studies: DG Chest [place] 1 View  Result Date: 03/11/2022 CLINICAL DATA:  66 year old female with a history intubation EXAM: PORTABLE CHEST 1 VIEW COMPARISON:  None Available. FINDINGS:  Cardiomediastinal silhouette borderline enlarged. Fullness in the central vasculature, potentially secondary to low lung volumes. Asymmetric elevation the right hemidiaphragm. No pneumothorax or pleural effusion. No confluent airspace disease. Endotracheal tube terminates at the clavicular heads. 5.2 cm above the carina. Generator partially visualized overlying the abdomen. IMPRESSION: Endotracheal tube terminates at the level of the clavicular heads, 5.2 cm above the carina. Low lung volumes, with likely atelectasis Electronically Signed   By: Gilmer Mor D.O.   On: 03/11/2022 09:26   DG Thoracic Spine 2 View  Result Date: 03/10/2022 CLINICAL DATA:  Peri op. EXAM: THORACIC SPINE 2 VIEWS COMPARISON:  None Available. FINDINGS: Fluoro time: 9 seconds. Reported cumulative air kerma: 2.7 mGy. Five C-arm fluoroscopic images were obtained intraoperatively and submitted for post operative interpretation. These images demonstrate placement of a thoracic spinal  cord stimulator with leads projecting midline. Please see the performing provider's procedural report for further detail. IMPRESSION: Intraoperative fluoroscopy, described above. Electronically Signed   By: Feliberto Harts M.D.   On: 03/10/2022 09:13   DG C-Arm 1-60 Min-No Report  Result Date: 03/10/2022 Fluoroscopy was utilized by the requesting physician.  No radiographic interpretation.   DG C-Arm 1-60 Min-No Report  Result Date: 03/10/2022 Fluoroscopy was utilized by the requesting physician.  No radiographic interpretation.        Scheduled Meds:  atorvastatin  20 mg Oral QPM   Chlorhexidine Gluconate Cloth  6 each Topical Q0600   docusate sodium  100 mg Oral BID   DULoxetine  60 mg Oral Daily   enoxaparin (LOVENOX) injection  0.5 mg/kg Subcutaneous Q24H   gabapentin  300 mg Oral BID   multivitamin with minerals  1 tablet Oral Daily   pantoprazole  40 mg Oral Daily   senna  1 tablet Oral BID   Continuous Infusions:  sodium  chloride     sodium chloride Stopped (03/10/22 2326)   famotidine (PEPCID) IV Stopped (03/10/22 2204)   methocarbamol (ROBAXIN) IV     norepinephrine (LEVOPHED) Adult infusion Stopped (03/10/22 1354)     LOS: 0 days    Time spent: 33 mins     Charise Killian, MD [facility] Pager [phone]  If 7PM-7AM, please contact night-coverage 03/11/2022, 4:21 PM

## 2022-03-11 NOTE — Progress Notes (Signed)
    Attending Progress Note  History: Tammy Stephens is here for chronic pain.  POD1: Events of post-op period noted.  She is doing well now, but has expected post-op pain.  Physical Exam: Vitals:   03/11/22 0500 03/11/22 0600  BP: (!) 116/98 (!) 90/51  Pulse: 99 86  Resp: 15 18  Temp:    SpO2: 95% 98%    AA Ox3 CNI  Strength:5/5 throughout BLE Stable loss of sensation of LLE  Data:  Recent Labs  Lab 03/10/22 1142  NA 139  K 3.3*  CL 106  CO2 27  BUN 16  CREATININE 0.72  GLUCOSE 163*  CALCIUM 8.3*   Recent Labs  Lab 03/10/22 1142  AST 110*  ALT 81*  ALKPHOS 92     Recent Labs  Lab 03/10/22 1028  WBC 14.0*  HGB 11.9*  HCT 38.2  PLT 264   No results for input(s): "APTT", "INR" in the last 168 hours.       Other tests/results: n/a  Assessment/Plan:  Tammy Stephens is stable on POD1 s/p stimulator placement  - mobilize - pain control - orals only - DVT prophylaxis - PTOT   Venetia Night MD, [facility] Department of Neurosurgery

## 2022-03-11 NOTE — Evaluation (Signed)
Physical Therapy Evaluation Patient Details Name: Tammy Stephens MRN: [id number] DOB: [birth year] Today's Date: 03/11/2022  History of Present Illness  Pt is a 66 y/o F admitted on 03/10/22. Pt has been diagnosed with chronic pain syndrome & had successful trial for spinal cord stimulator so underwent placement of permanent device by Dr. Myer Haff on 03/10/22. Postop pt remained mechanically intubated due to encephalopathy and developed hypotension requiring vasopressors. PMH: arthritis, cataracts, depression, GERD, kidney stones, HLD, HTN, stroke  Clinical Impression  Pt seen for PT evaluation with pt received in room with daughter present for session. Pt reports prior to admission she was mod I with gait with SPC. PT educates pt on back precautions & pt is able to complete bed mobility with supervision & bed rails PRN. Pt requires min assist for transfers & short distance gait with chair follow for safety. Pt does endorse feeling "woozy" & "dizzy" with mobility so further gait deferred. Pt demonstrates impaired balance during gait & would benefit from additional PT prior to d/c home with daughter. Of note, pt plans to return home with daughter providing 24 hr care x 1 week, followed by 1 week home alone, then daughter coming to move pt to [place].  BP taken in LUE Sitting EOB: 119/78 mmHg MAP 89 Standing: 119/103 mmHg MAP 110 Sitting in recliner: 119/72 mmHg MAP 86 After gait, sitting: 117/65 mmHg MAP 78 At end of session in recliner: 111/75 mmHg MPA 88 After toileting on BSC, sitting in recliner: 119/76 mmHg MAP 90     Recommendations for follow up therapy are one component of a multi-disciplinary discharge planning process, led by the attending physician.  Recommendations may be updated based on patient status, additional functional criteria and insurance authorization.  Follow Up Recommendations Home health PT    Assistance Recommended at Discharge Frequent or constant  Supervision/Assistance  Patient can return home with the following  A lot of help with bathing/dressing/bathroom;A lot of help with walking and/or transfers;Assistance with cooking/housework;Help with stairs or ramp for entrance;Assist for transportation    Equipment Recommendations Rolling walker (2 wheels)  Recommendations for Other Services       Functional Status Assessment Patient has had a recent decline in their functional status and demonstrates the ability to make significant improvements in function in a reasonable and predictable amount of time.     Precautions / Restrictions Precautions Precautions: Fall;Back Restrictions Weight Bearing Restrictions: No      Mobility  Bed Mobility Overal bed mobility: Needs Assistance Bed Mobility: Sidelying to Sit   Sidelying to sit: Supervision, HOB elevated (use of bed rails PRN)       General bed mobility comments: cuing for log rolling    Transfers Overall transfer level: Needs assistance Equipment used: Rolling walker (2 wheels) Transfers: Sit to/from Stand, Bed to chair/wheelchair/BSC Sit to Stand: Min assist   Step pivot transfers: Min assist       General transfer comment: cuing for safe hand placement during STS    Ambulation/Gait Ambulation/Gait assistance: Min assist (chair follow for safety) Gait Distance (Feet): 30 Feet Assistive device: Rolling walker (2 wheels)   Gait velocity: decreased     General Gait Details: Inconsistent step length, lateral instability  Stairs            Wheelchair Mobility    Modified Rankin (Stroke Patients Only)       Balance Overall balance assessment: Needs assistance Sitting-balance support: Feet supported, Bilateral upper extremity supported Sitting balance-Leahy Scale: Fair  Standing balance support: During functional activity, Bilateral upper extremity supported, Reliant on assistive device for balance Standing balance-Leahy Scale: Poor                                Pertinent Vitals/Pain Pain Assessment Pain Assessment: 0-10 Pain Score:  (3/10 back, 7/10 HA) Pain Descriptors / Indicators: Discomfort Pain Intervention(s): Monitored during session (reported MD is aware of HA)    Home Living Family/patient expects to be discharged to:: Private residence Living Arrangements: Alone Available Help at Discharge: Family;Available PRN/intermittently Type of Home: House (condo) Home Access: Stairs to enter Entrance Stairs-Rails: Right;Left;Can reach both Secretary/administrator of Steps: 3   Home Layout: One level Home Equipment: Cane - single point      Prior Function Prior Level of Function : Independent/Modified Independent;Driving             Mobility Comments: Pt was ambulatory with SPC, endorses 6-7 falls in past 6 months (reports chronic issues with LLE following hip sx years ago), recently retired from [employer]        Extremity/Trunk Assessment   Upper Extremity Assessment Upper Extremity Assessment: Generalized weakness    Lower Extremity Assessment Lower Extremity Assessment: Generalized weakness       Communication   Communication: No difficulties  Cognition Arousal/Alertness: Awake/alert Behavior During Therapy: WFL for tasks assessed/performed Overall Cognitive Status: Within Functional Limits for tasks assessed                                          General Comments General comments (skin integrity, edema, etc.): Pt assisted to [facility] with daughter (when PT was out of room obtaining gait belt) & PT assisted her back to recliner with min assist stand pivot.    Exercises     Assessment/Plan    PT Assessment Patient needs continued PT services  PT Problem List Decreased strength;Decreased coordination;Cardiopulmonary status limiting activity;Pain;Decreased activity tolerance;Decreased knowledge of use of DME;Decreased balance;Decreased safety  awareness;Decreased mobility;Decreased knowledge of precautions       PT Treatment Interventions DME instruction;Therapeutic exercise;Gait training;Balance training;Stair training;Neuromuscular re-education;Manual techniques;Modalities;Functional mobility training;Therapeutic activities;Patient/family education    PT Goals (Current goals can be found in the Care Plan section)  Acute Rehab PT Goals Patient Stated Goal: get better PT Goal Formulation: With patient/family Time For Goal Achievement: 03/25/22 Potential to Achieve Goals: Good    Frequency 7X/week     Co-evaluation               AM-PAC PT "6 Clicks" Mobility  Outcome Measure Help needed turning from your back to your side while in a flat bed without using bedrails?: A Little Help needed moving from lying on your back to sitting on the side of a flat bed without using bedrails?: A Little Help needed moving to and from a bed to a chair (including a wheelchair)?: A Little Help needed standing up from a chair using your arms (e.g., wheelchair or bedside chair)?: A Little Help needed to walk in hospital room?: A Little Help needed climbing 3-5 steps with a railing? : A Lot 6 Click Score: 17    End of Session Equipment Utilized During Treatment: Gait belt Activity Tolerance:  (limited by feeling "woozy") Patient left: in chair;with call bell/phone within reach;with family/visitor present Nurse  Communication: Mobility status PT Visit Diagnosis: Unsteadiness on feet (R26.81);Muscle weakness (generalized) (M62.81);Difficulty in walking, not elsewhere classified (R26.2);Other abnormalities of gait and mobility (R26.89)    Time: 8315-1761 PT Time Calculation (min) (ACUTE ONLY): 41 min   Charges:   PT Evaluation $PT Eval Moderate Complexity: 1 Mod PT Treatments $Therapeutic Activity: 23-37 mins        Aleda Grana, PT, DPT 03/11/22, 1:23 PM   Sandi Mariscal 03/11/2022, 1:19 PM

## 2022-03-12 DIAGNOSIS — R7401 Elevation of levels of liver transaminase levels: Secondary | ICD-10-CM | POA: Diagnosis not present

## 2022-03-12 DIAGNOSIS — I951 Orthostatic hypotension: Secondary | ICD-10-CM | POA: Diagnosis not present

## 2022-03-12 DIAGNOSIS — Z9689 Presence of other specified functional implants: Secondary | ICD-10-CM | POA: Diagnosis not present

## 2022-03-12 LAB — COMPREHENSIVE METABOLIC PANEL
ALT: 53 U/L — ABNORMAL HIGH (ref 0–44)
AST: 29 U/L (ref 15–41)
Albumin: 3.4 g/dL — ABNORMAL LOW (ref 3.5–5.0)
Alkaline Phosphatase: 81 U/L (ref 38–126)
Anion gap: 5 (ref 5–15)
BUN: 14 mg/dL (ref 8–23)
CO2: 30 mmol/L (ref 22–32)
Calcium: 8.8 mg/dL — ABNORMAL LOW (ref 8.9–10.3)
Chloride: 105 mmol/L (ref 98–111)
Creatinine, Ser: 0.75 mg/dL (ref 0.44–1.00)
GFR, Estimated: >60 mL/min (ref >60)
Glucose, Bld: 129 mg/dL — ABNORMAL HIGH (ref 70–99)
Potassium: 5 mmol/L (ref 3.5–5.1)
Sodium: 140 mmol/L (ref 135–145)
Total Bilirubin: 0.5 mg/dL (ref 0.3–1.2)
Total Protein: 6.1 g/dL — ABNORMAL LOW (ref 6.5–8.1)

## 2022-03-12 LAB — CBC
HCT: 36.1 % (ref 36.0–46.0)
Hemoglobin: 11 g/dL — ABNORMAL LOW (ref 12.0–15.0)
MCH: 28.7 pg (ref 26.0–34.0)
MCHC: 30.5 g/dL (ref 30.0–36.0)
MCV: 94.3 fL (ref 80.0–100.0)
Platelets: 220 10*3/uL (ref 150–400)
RBC: 3.83 MIL/uL — ABNORMAL LOW (ref 3.87–5.11)
RDW: 13.4 % (ref 11.5–15.5)
WBC: 14.8 10*3/uL — ABNORMAL HIGH (ref 4.0–10.5)
nRBC: 0 % (ref 0.0–0.2)

## 2022-03-12 LAB — HEMOGLOBIN A1C
Hgb A1c MFr Bld: 5.9 % — ABNORMAL HIGH (ref 4.8–5.6)
Mean Plasma Glucose: 122.63 mg/dL

## 2022-03-12 MED ORDER — SODIUM CHLORIDE 0.9 % IV SOLN: INTRAVENOUS | Status: DC

## 2022-03-12 MED ORDER — OXYCODONE HCL 10 MG PO TABS: 10.0000 mg | ORAL_TABLET | Freq: Four times a day (QID) | ORAL | 0 refills | Status: AC | PRN

## 2022-03-12 NOTE — TOC Initial Note (Signed)
Transition of Care [facility]) - Initial/Assessment Note    Patient Details  Name: Tammy Stephens MRN: [id number] Date of Birth: Dec 15, 1955  Transition of [facility]) CM/SW Contact:    Liliana Cline, LCSW Phone Number: 03/12/2022, 10:57 AM  Clinical Narrative:         CSW spoke with patient's daughter Camelia Eng who is at bedside.  Patient lives alone and usually drives herself to appointments. PCP is Manson Allan NP at [facility]. Pharmacy is [facility] in [place]. Patient has a cane at home. Patient has gone to [facility] in the past.  Patient and daughter are agreeable to HHPT and RW. Confirmed home address. Adelina Mings with [facility] accepted HHPT referral. Elease Hashimoto with [facility] given RW referral for delivery to bedside.           Expected Discharge Plan: Home w Home Health Services Barriers to Discharge: Continued Medical Work up   Patient Goals and CMS Choice Patient states their goals for this hospitalization and ongoing recovery are:: home with home health CMS Medicare.gov Compare Post Acute Care list provided to:: Patient Choice offered to / list presented to : Adult Children, Patient  Expected Discharge Plan and Services Expected Discharge Plan: Home w Home Health Services       Living arrangements for the past 2 months: Single Family Home                 DME Arranged: Walker rolling DME Agency: [facility] Date DME Agency Contacted: 03/12/22   Representative spoke with at DME Agency: Elease Hashimoto HH Arranged: PT HH Agency: [facility] Date [facility] Agency Contacted: 03/12/22   Representative spoke with at [facility] Agency: Adelina Mings  Prior Living Arrangements/Services Living arrangements for the past 2 months: Single Family Home Lives with:: Self Patient language and need for interpreter reviewed:: Yes Do you feel safe going back to the place where you live?: Yes      Need for Family Participation in Patient Care: Yes (Comment) Care giver support system in place?: Yes  (comment) Current home services: DME Criminal Activity/Legal Involvement Pertinent to Current Situation/Hospitalization: No - Comment as needed  Activities of Daily Living Home Assistive Devices/Equipment: Eyeglasses ADL Screening (condition at time of admission) Patient's cognitive ability adequate to safely complete daily activities?: Yes Is the patient deaf or have difficulty hearing?: No Does the patient have difficulty seeing, even when wearing glasses/contacts?: No Does the patient have difficulty concentrating, remembering, or making decisions?: No Patient able to express need for assistance with ADLs?: Yes Does the patient have difficulty dressing or bathing?: No Independently performs ADLs?: Yes (appropriate for developmental age) Does the patient have difficulty walking or climbing stairs?: Yes Weakness of Legs: Both Weakness of Arms/Hands: Both  Permission Sought/Granted Permission sought to share information with : Oceanographer granted to share information with : Yes, Verbal Permission Granted     Permission granted to share info w AGENCY: HH and DME agencies        Emotional Assessment       Orientation: : Oriented to Self, Oriented to Place, Oriented to  Time, Oriented to Situation Alcohol / Substance Use: Not Applicable Psych Involvement: No (comment)  Admission diagnosis:  Spinal cord stimulator status [Z96.89] Orthostatic hypotension [I95.1] Patient Active Problem List   Diagnosis Date Noted   Orthostatic hypotension 03/11/2022   Spinal cord stimulator status 03/10/2022   Pre-operative laboratory examination    Neuropathic pain of lower extremity, right 01/05/2022   Pain in right lower leg  01/05/2022   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 [place] [address] Phone: [phone] Fax: [phone]     Social Determinants of Health (SDOH) Interventions    Readmission Risk Interventions    03/12/2022   10:16 AM  Readmission Risk Prevention Plan  Post Dischage Appt Complete  Medication Screening Complete  Transportation Screening Complete

## 2022-03-12 NOTE — Progress Notes (Signed)
Mobility Specialist - Progress Note    03/12/22 1400  Mobility  Activity Contraindicated/medical hold    Per RN, to hold off on mobility at this time d/t pt resting and still receiving fluids for BP management. Will attempt at another date and time.  Clarisa Schools Mobility Specialist 03/12/22, 2:46 PM

## 2022-03-12 NOTE — Progress Notes (Signed)
Physical Therapy Treatment Patient Details Name: Tammy Stephens MRN: [id number] DOB: [birth year] Today's Date: 03/12/2022   History of Present Illness Pt is a 66 y/o F admitted on 03/10/22. Pt has been diagnosed with chronic pain syndrome & had successful trial for spinal cord stimulator so underwent placement of permanent device by Dr. Myer Haff on 03/10/22. Postop pt remained mechanically intubated due to encephalopathy and developed hypotension requiring vasopressors. PMH: arthritis, cataracts, depression, GERD, kidney stones, HLD, HTN, stroke    PT Comments    Pt seen for PT tx with pt's daughter present for session. Pt's daughter able to provide cuing for log rolling but pt able to complete bed mobility with supervision. Pt stands with SPC but needs BUE support for gait so PT provides pt with RW. Pt ambulates into hallway with improving balance but still more leaning to R & decreased gait speed. Pt endorses dizziness with mobility so pt instructed to sit & vitals checked. Pt + for orthostatic hypotension & nurse & MD notified. Further mobility deferred at this time.  BP LUE sitting after ~3 minutes (after walking): 115/60 mmHg (MAP 76), HR 93  standing at 0: 83/48 mmHg, (MAP 56)  reclined in chair: 127/71 mmHg (MAP 85), HR 99 bpm    Recommendations for follow up therapy are one component of a multi-disciplinary discharge planning process, led by the attending physician.  Recommendations may be updated based on patient status, additional functional criteria and insurance authorization.  Follow Up Recommendations  Home health PT     Assistance Recommended at Discharge Frequent or constant Supervision/Assistance  Patient can return home with the following A lot of help with bathing/dressing/bathroom;A lot of help with walking and/or transfers;Assistance with cooking/housework;Help with stairs or ramp for entrance;Assist for transportation   Equipment Recommendations  Rolling walker (2  wheels)    Recommendations for Other Services       Precautions / Restrictions Precautions Precautions: Fall;Back Restrictions Weight Bearing Restrictions: No     Mobility  Bed Mobility Overal bed mobility: Needs Assistance Bed Mobility: Rolling, Sidelying to Sit Rolling: Supervision Sidelying to sit: Supervision       General bed mobility comments: HOB slightly elevated    Transfers Overall transfer level: Needs assistance Equipment used: Straight cane Transfers: Sit to/from Stand Sit to Stand: Min guard           General transfer comment: STS from EOB with SPC    Ambulation/Gait Ambulation/Gait assistance: Min assist (chair follow for safety) Gait Distance (Feet): 65 Feet Assistive device: Rolling walker (2 wheels) Gait Pattern/deviations: Decreased weight shift to left, Decreased step length - right, Decreased step length - left, Decreased dorsiflexion - right, Decreased dorsiflexion - left, Decreased stride length (Pt stood with SPC but recognized need for BUE support for mobiltiy so PT provided pt with RW.) Gait velocity: decreased     General Gait Details: slight lean to R   Stairs             Wheelchair Mobility    Modified Rankin (Stroke Patients Only)       Balance Overall balance assessment: Needs assistance Sitting-balance support: Feet supported, Bilateral upper extremity supported Sitting balance-Leahy Scale: Good Sitting balance - Comments: supervision sitting EOB   Standing balance support: During functional activity, Bilateral upper extremity supported, Reliant on assistive device for balance Standing balance-Leahy Scale: Fair  Cognition Arousal/Alertness: Awake/alert Behavior During Therapy: WFL for tasks assessed/performed, Anxious Overall Cognitive Status: Within Functional Limits for tasks assessed                                 General Comments: Requires cuing  from daughter to recall log rolling for bed mobility. Eager to go home.        Exercises      General Comments        Pertinent Vitals/Pain Pain Assessment Pain Assessment: Faces Faces Pain Scale: Hurts a little bit Pain Location: back Pain Descriptors / Indicators: Discomfort Pain Intervention(s): Monitored during session    Home Living                          Prior Function            PT Goals (current goals can now be found in the care plan section) Acute Rehab PT Goals Patient Stated Goal: get better PT Goal Formulation: With patient/family Time For Goal Achievement: 03/25/22 Potential to Achieve Goals: Good Progress towards PT goals: Progressing toward goals    Frequency    7X/week      PT Plan Current plan remains appropriate    Co-evaluation              AM-PAC PT "6 Clicks" Mobility   Outcome Measure  Help needed turning from your back to your side while in a flat bed without using bedrails?: None Help needed moving from lying on your back to sitting on the side of a flat bed without using bedrails?: None Help needed moving to and from a bed to a chair (including a wheelchair)?: A Little Help needed standing up from a chair using your arms (e.g., wheelchair or bedside chair)?: A Little Help needed to walk in hospital room?: A Little Help needed climbing 3-5 steps with a railing? : A Little 6 Click Score: 20    End of Session Equipment Utilized During Treatment: Gait belt Activity Tolerance:  (limited by dizziness) Patient left: in chair;with call bell/phone within reach;with family/visitor present Nurse Communication:  (BP) PT Visit Diagnosis: Unsteadiness on feet (R26.81);Muscle weakness (generalized) (M62.81);Difficulty in walking, not elsewhere classified (R26.2);Other abnormalities of gait and mobility (R26.89)     Time: 1610-9604 PT Time Calculation (min) (ACUTE ONLY): 16 min  Charges:  $Therapeutic Activity: 8-22  mins                     Aleda Grana, PT, DPT 03/12/22, 12:48 PM   Sandi Mariscal 03/12/2022, 12:46 PM

## 2022-03-12 NOTE — Progress Notes (Signed)
PROGRESS NOTE    Tammy Stephens  EXH:[id number] DOB: 10-Sep-1956 DOA: 03/10/2022 PCP: Alm Bustard, NP   Assessment & Plan:   Principal Problem:   Spinal cord stimulator status Active Problems:   Pre-operative laboratory examination   Orthostatic hypotension  Assessment and Plan:  Acute hypoxic respiratory failure: intubation, ventilated & extubated. Pt had difficulty waking up after surg on 03/10/22. Currently on RA. Resolved   Hypotension: likely secondary to sedating medications & orthostatic hypotension. Required vasopressors but since has been weaned off. Still orthostatic today when working w/ therapy. Started on IVFs   Acute encephalopathy: resolved     Chronic pain syndrome: s/p spinal cord stimulator placement on 03/10/22 as per neuro surg. Po pain meds only   Hypokalemia: within normal limits today   Leukocytosis: likely reactive and trending down   Transaminitis: etiology unclear. AST is WNL and ALT is still elevated & trending down        DVT prophylaxis: lovenox  Code Status: full  Family Communication: discussed pt's care w/ pt's daughter at bedside and answered her questions  Disposition Plan: likely d/c back home   Level of care:med-surg Status is: Inpatient  Remains inpatient appropriate because: orthostatic hypotension        Consultants:  Neuro surg  ICU  Procedures:   Antimicrobials:    Subjective: Pt c/o urinary incontinence   Objective: Vitals:   03/12/22 0700 03/12/22 0800 03/12/22 0840 03/12/22 0844  BP: 122/68 119/69 117/67 115/64  Pulse: 92 88 92 93  Resp: 19 12 16    Temp:  (!) 97.5 F (36.4 C) 98.3 F (36.8 C) 98.3 F (36.8 C)  TempSrc:  Oral Oral Oral  SpO2: 92% 96%  96%  Weight:      Height:        Intake/Output Summary (Last 24 hours) at 03/12/2022 1308 Last data filed at 03/12/2022 0800 Gross per 24 hour  Intake 242.41 ml  Output 2575 ml  Net -2332.59 ml   Filed Weights   03/10/22 0626 03/10/22 1000   Weight: 90.7 kg 95.6 kg    Examination:  General exam: Appears comfortable  Respiratory system: clear breath sounds b/l Cardiovascular system: S1/S2+. No rubs or clicks. Gastrointestinal system: Abd is soft, NT, obese & hypoactive bowel sounds Central nervous system: alert and oriented. Moves all extremities  Psychiatry: judgement and insight appears normal. Appropriate mood and affect    Data Reviewed: I have personally reviewed following labs and imaging studies  CBC: Recent Labs  Lab 03/10/22 1028 03/11/22 0850 03/12/22 0452  WBC 14.0* 19.8* 14.8*  NEUTROABS 11.8*  --   --   HGB 11.9* 11.1* 11.0*  HCT 38.2 36.2 36.1  MCV 94.1 93.1 94.3  PLT 264 225 220   Basic Metabolic Panel: Recent Labs  Lab 03/10/22 1142 03/11/22 0850 03/12/22 0452  NA 139 136 140  K 3.3* 4.5 5.0  CL 106 103 105  CO2 27 27 30   GLUCOSE 163* 144* 129*  BUN 16 11 14   CREATININE 0.72 0.80 0.75  CALCIUM 8.3* 8.7* 8.8*  MG 2.1  --   --   PHOS 3.6  --   --    GFR: Estimated Creatinine Clearance: 73.6 mL/min (by C-G formula based on SCr of 0.75 mg/dL). Liver Function Tests: Recent Labs  Lab 03/10/22 1142 03/12/22 0452  AST 110* 29  ALT 81* 53*  ALKPHOS 92 81  BILITOT 0.6 0.5  PROT 6.0* 6.1*  ALBUMIN 3.3* 3.4*   No results for  input(s): "LIPASE", "AMYLASE" in the last 168 hours. No results for input(s): "AMMONIA" in the last 168 hours. Coagulation Profile: No results for input(s): "INR", "PROTIME" in the last 168 hours. Cardiac Enzymes: No results for input(s): "CKTOTAL", "CKMB", "CKMBINDEX", "TROPONINI" in the last 168 hours. BNP (last 3 results) No results for input(s): "PROBNP" in the last 8760 hours. HbA1C: Recent Labs    03/12/22 0452  HGBA1C 5.9*   CBG: Recent Labs  Lab 03/10/22 0955  GLUCAP 132*   Lipid Profile: No results for input(s): "CHOL", "HDL", "LDLCALC", "TRIG", "CHOLHDL", "LDLDIRECT" in the last 72 hours. Thyroid Function Tests: No results for input(s):  "TSH", "T4TOTAL", "FREET4", "T3FREE", "THYROIDAB" in the last 72 hours. Anemia Panel: No results for input(s): "VITAMINB12", "FOLATE", "FERRITIN", "TIBC", "IRON", "RETICCTPCT" in the last 72 hours. Sepsis Labs: Recent Labs  Lab 03/10/22 1028  LATICACIDVEN 2.6*    Recent Results (from the past 240 hour(s))  Surgical pcr screen     Status: None   Collection Time: 03/03/22  1:30 PM   Specimen: Nasal Mucosa; Nasal Swab  Result Value Ref Range Status   MRSA, PCR NEGATIVE NEGATIVE Final   Staphylococcus aureus NEGATIVE NEGATIVE Final    Comment: (NOTE) The Xpert SA Assay (FDA approved for NASAL specimens in patients 64 years of age and older), is one component of a comprehensive surveillance program. It is not intended to diagnose infection nor to guide or monitor treatment. Performed at [facility], [address] [place], [place] [address]   Urine Culture     Status: None   Collection Time: 03/03/22  1:30 PM   Specimen: Urine, Clean Catch  Result Value Ref Range Status   Specimen Description   Final    URINE, CLEAN CATCH Performed at [facility], [address] [place], [place] [address]    Special Requests   Final    NONE Performed at [facility], [address], [place], [place] [address]    Culture   Final    NO GROWTH Performed at [facility], [address] [place], [place] [address]    Report Status 03/07/2022 FINAL  Final  MRSA Next Gen by PCR, Nasal     Status: None   Collection Time: 03/10/22  9:56 AM   Specimen: Nasal Mucosa; Nasal Swab  Result Value Ref Range Status   MRSA by PCR Next Gen NOT DETECTED NOT DETECTED Final    Comment: (NOTE) The GeneXpert MRSA Assay (FDA approved for NASAL specimens only), is one component of a comprehensive MRSA colonization surveillance program. It is not intended to diagnose MRSA infection nor to guide or monitor treatment for MRSA infections. Test performance is not FDA approved in  patients less than 73 years old. Performed at [facility], [address], [place], [place] [address]          Radiology Studies: No results found.      Scheduled Meds:  atorvastatin  20 mg Oral QPM   Chlorhexidine Gluconate Cloth  6 each Topical Q0600   docusate sodium  100 mg Oral BID   DULoxetine  60 mg Oral Daily   enoxaparin (LOVENOX) injection  0.5 mg/kg Subcutaneous Q24H   gabapentin  300 mg Oral BID   multivitamin with minerals  1 tablet Oral Daily   pantoprazole  40 mg Oral Daily   senna  1 tablet Oral BID   Continuous Infusions:  sodium chloride     sodium chloride Stopped (03/11/22 2310)  sodium chloride     methocarbamol (ROBAXIN) IV       LOS: 1 day    Time spent: 30 mins     Charise Killian, MD [facility] Pager [phone]  If 7PM-7AM, please contact night-coverage 03/12/2022, 1:08 PM

## 2022-03-13 DIAGNOSIS — I951 Orthostatic hypotension: Secondary | ICD-10-CM | POA: Diagnosis not present

## 2022-03-13 DIAGNOSIS — G894 Chronic pain syndrome: Secondary | ICD-10-CM

## 2022-03-13 DIAGNOSIS — Z9689 Presence of other specified functional implants: Secondary | ICD-10-CM | POA: Diagnosis not present

## 2022-03-13 LAB — COMPREHENSIVE METABOLIC PANEL
ALT: 43 U/L (ref 0–44)
AST: 22 U/L (ref 15–41)
Albumin: 2.8 g/dL — ABNORMAL LOW (ref 3.5–5.0)
Alkaline Phosphatase: 75 U/L (ref 38–126)
Anion gap: 4 — ABNORMAL LOW (ref 5–15)
BUN: 12 mg/dL (ref 8–23)
CO2: 31 mmol/L (ref 22–32)
Calcium: 8.5 mg/dL — ABNORMAL LOW (ref 8.9–10.3)
Chloride: 106 mmol/L (ref 98–111)
Creatinine, Ser: 0.99 mg/dL (ref 0.44–1.00)
GFR, Estimated: >60 mL/min (ref >60)
Glucose, Bld: 91 mg/dL (ref 70–99)
Potassium: 4 mmol/L (ref 3.5–5.1)
Sodium: 141 mmol/L (ref 135–145)
Total Bilirubin: 0.4 mg/dL (ref 0.3–1.2)
Total Protein: 5.2 g/dL — ABNORMAL LOW (ref 6.5–8.1)

## 2022-03-13 LAB — CBC
HCT: 33.8 % — ABNORMAL LOW (ref 36.0–46.0)
Hemoglobin: 10.4 g/dL — ABNORMAL LOW (ref 12.0–15.0)
MCH: 29.7 pg (ref 26.0–34.0)
MCHC: 30.8 g/dL (ref 30.0–36.0)
MCV: 96.6 fL (ref 80.0–100.0)
Platelets: 198 10*3/uL (ref 150–400)
RBC: 3.5 MIL/uL — ABNORMAL LOW (ref 3.87–5.11)
RDW: 13.8 % (ref 11.5–15.5)
WBC: 8.1 10*3/uL (ref 4.0–10.5)
nRBC: 0 % (ref 0.0–0.2)

## 2022-03-13 MED ORDER — PNEUMOCOCCAL 20-VAL CONJ VACC 0.5 ML IM SUSY
0.5000 mL | PREFILLED_SYRINGE | INTRAMUSCULAR | Status: DC
  Filled 2022-03-13 (×2): qty 0.5

## 2022-03-13 MED ORDER — MIDODRINE HCL 5 MG PO TABS
10.0000 mg | ORAL_TABLET | Freq: Three times a day (TID) | ORAL | Status: DC
Start: 2022-03-13 — End: 2022-03-14
  Administered 2022-03-13 – 2022-03-14 (×2): 10 mg via ORAL
  Filled 2022-03-13 (×2): qty 2

## 2022-03-13 MED ORDER — MIDODRINE HCL 5 MG PO TABS
5.0000 mg | ORAL_TABLET | Freq: Three times a day (TID) | ORAL | Status: DC
  Administered 2022-03-13: 5 mg via ORAL
  Filled 2022-03-13: qty 1

## 2022-03-13 MED ORDER — LACTULOSE 10 GM/15ML PO SOLN
30.0000 g | Freq: Two times a day (BID) | ORAL | Status: DC
  Administered 2022-03-13: 30 g via ORAL
  Filled 2022-03-13 (×5): qty 60

## 2022-03-13 NOTE — Progress Notes (Signed)
PROGRESS NOTE    Tammy Stephens  QMV:[id number] DOB: 02/18/1956 DOA: 03/10/2022 PCP: Alm Bustard, NP   Assessment & Plan:   Principal Problem:   Spinal cord stimulator status Active Problems:   Pre-operative laboratory examination   Orthostatic hypotension  Assessment and Plan:  Acute hypoxic respiratory failure: intubation, ventilated & extubated. Pt had difficulty waking up after surg on 03/10/22. Currently on RA. Resolved   Hypotension: likely secondary to sedating medications & orthostatic hypotension. Required vasopressors but since has been weaned off. Still orthostatic this morning, continue on IVFs and started on midodrine 5mg  TID but pt was still orthostatic this afternoon so increased midodrine to 10mg  TID.   Acute encephalopathy: resolved     Chronic pain syndrome: s/p spinal cord stimulator placement on 03/10/22 as per neuro surg. PO pain meds only as per neuro surg   Hypokalemia: WNL  Leukocytosis: resolved   Transaminitis: resolved        DVT prophylaxis: lovenox  Code Status: full  Family Communication: discussed pt's care w/ pt's daughter at bedside and answered her questions  Disposition Plan: likely d/c back home   Level of care:med-surg Status is: Inpatient  Remains inpatient appropriate because: orthostatic hypotension, hopefully can d/c home tomorrow         Consultants:  Neuro surg  ICU  Procedures:   Antimicrobials:    Subjective: Pt c/o feeling dizzy w/ walking   Objective: Vitals:   03/13/22 0343 03/13/22 0730 03/13/22 0910 03/13/22 1115  BP: 124/72 127/70 133/84 115/69  Pulse: 87 88 90 95  Resp: 18 12 16 16   Temp: 98.9 F (37.2 C) 98.7 F (37.1 C) 99.3 F (37.4 C) 98.5 F (36.9 C)  TempSrc:  Oral  Oral  SpO2: 100% 92% 96% 91%  Weight:      Height:        Intake/Output Summary (Last 24 hours) at 03/13/2022 1557 Last data filed at 03/13/2022 1400 Gross per 24 hour  Intake 1042.53 ml  Output --  Net 1042.53  ml   Filed Weights   03/10/22 0626 03/10/22 1000  Weight: 90.7 kg 95.6 kg    Examination:  General exam: Appears calm & comfortable  Respiratory system: clear breath sounds b/l Cardiovascular system: S1 & S2+. No rubs or clicks  Gastrointestinal system: Abd is soft, NT, obese & hypoactive bowel sounds  Central nervous system: Alert and oriented. Moves all extremities  Psychiatry: Judgement and insight appears normal. Appropriate mood and affect     Data Reviewed: I have personally reviewed following labs and imaging studies  CBC: Recent Labs  Lab 03/10/22 1028 03/11/22 0850 03/12/22 0452 03/13/22 0608  WBC 14.0* 19.8* 14.8* 8.1  NEUTROABS 11.8*  --   --   --   HGB 11.9* 11.1* 11.0* 10.4*  HCT 38.2 36.2 36.1 33.8*  MCV 94.1 93.1 94.3 96.6  PLT 264 225 220 198   Basic Metabolic Panel: Recent Labs  Lab 03/10/22 1142 03/11/22 0850 03/12/22 0452 03/13/22 0608  NA 139 136 140 141  K 3.3* 4.5 5.0 4.0  CL 106 103 105 106  CO2 27 27 30 31   GLUCOSE 163* 144* 129* 91  BUN 16 11 14 12   CREATININE 0.72 0.80 0.75 0.99  CALCIUM 8.3* 8.7* 8.8* 8.5*  MG 2.1  --   --   --   PHOS 3.6  --   --   --    GFR: Estimated Creatinine Clearance: 59.5 mL/min (by C-G formula based on SCr of  0.99 mg/dL). Liver Function Tests: Recent Labs  Lab 03/10/22 1142 03/12/22 0452 03/13/22 0608  AST 110* 29 22  ALT 81* 53* 43  ALKPHOS 92 81 75  BILITOT 0.6 0.5 0.4  PROT 6.0* 6.1* 5.2*  ALBUMIN 3.3* 3.4* 2.8*   No results for input(s): "LIPASE", "AMYLASE" in the last 168 hours. No results for input(s): "AMMONIA" in the last 168 hours. Coagulation Profile: No results for input(s): "INR", "PROTIME" in the last 168 hours. Cardiac Enzymes: No results for input(s): "CKTOTAL", "CKMB", "CKMBINDEX", "TROPONINI" in the last 168 hours. BNP (last 3 results) No results for input(s): "PROBNP" in the last 8760 hours. HbA1C: Recent Labs    03/12/22 0452  HGBA1C 5.9*   CBG: Recent Labs  Lab  03/10/22 0955  GLUCAP 132*   Lipid Profile: No results for input(s): "CHOL", "HDL", "LDLCALC", "TRIG", "CHOLHDL", "LDLDIRECT" in the last 72 hours. Thyroid Function Tests: No results for input(s): "TSH", "T4TOTAL", "FREET4", "T3FREE", "THYROIDAB" in the last 72 hours. Anemia Panel: No results for input(s): "VITAMINB12", "FOLATE", "FERRITIN", "TIBC", "IRON", "RETICCTPCT" in the last 72 hours. Sepsis Labs: Recent Labs  Lab 03/10/22 1028  LATICACIDVEN 2.6*    Recent Results (from the past 240 hour(s))  MRSA Next Gen by PCR, Nasal     Status: None   Collection Time: 03/10/22  9:56 AM   Specimen: Nasal Mucosa; Nasal Swab  Result Value Ref Range Status   MRSA by PCR Next Gen NOT DETECTED NOT DETECTED Final    Comment: (NOTE) The GeneXpert MRSA Assay (FDA approved for NASAL specimens only), is one component of a comprehensive MRSA colonization surveillance program. It is not intended to diagnose MRSA infection nor to guide or monitor treatment for MRSA infections. Test performance is not FDA approved in patients less than 46 years old. Performed at [facility], [address], [place], [place] [address]          Radiology Studies: No results found.      Scheduled Meds:  atorvastatin  20 mg Oral QPM   Chlorhexidine Gluconate Cloth  6 each Topical Q0600   docusate sodium  100 mg Oral BID   DULoxetine  60 mg Oral Daily   enoxaparin (LOVENOX) injection  0.5 mg/kg Subcutaneous Q24H   gabapentin  300 mg Oral BID   lactulose  30 g Oral BID   midodrine  10 mg Oral TID WC   multivitamin with minerals  1 tablet Oral Daily   pantoprazole  40 mg Oral Daily   [START ON 03/14/2022] pneumococcal 20-valent conjugate vaccine  0.5 mL Intramuscular Tomorrow-1000   senna  1 tablet Oral BID   Continuous Infusions:  sodium chloride     sodium chloride Stopped (03/11/22 2310)   sodium chloride 75 mL/hr at 03/13/22 0044   methocarbamol (ROBAXIN) IV       LOS: 2 days     Time spent: 33 mins     Charise Killian, MD [facility] Pager [phone]  If 7PM-7AM, please contact night-coverage 03/13/2022, 3:57 PM

## 2022-03-13 NOTE — Progress Notes (Signed)
Physical Therapy Treatment Patient Details Name: Tammy Stephens MRN: [id number] DOB: 05/20/1956 Today's Date: 03/13/2022   History of Present Illness Pt is a 66 y/o F admitted on 03/10/22. Pt has been diagnosed with chronic pain syndrome & had successful trial for spinal cord stimulator so underwent placement of permanent device by Dr. Myer Haff on 03/10/22. Postop pt remained mechanically intubated due to encephalopathy and developed hypotension requiring vasopressors. PMH: arthritis, cataracts, depression, GERD, kidney stones, HLD, HTN, stroke    PT Comments    Pt seen for PT tx with daughter present. Pt endorses feeling a little better compared to this AM. Pt completes bed mobility without physical assistance & ambulates bed<>bathroom without AD & min assist but pt reaching for furniture/objects for UE support. PT educated pt on importance of using RW & pt ambulates room>gym with RW & slightly improving balance. Pt negotiates 4 steps with B rails & CGA with step to pattern with PT then return demonstrates with daughter's assistance but after ascending 3 steps pt endorses significant dizziness & PT assists pt with descending stairs & sitting in recliner. Pt did require seated rest breaks between all mobility tasks & PT educates pt on importance of pacing herself upon d/c home. Also educated pt & daughter on importance of hands on assist for all mobility at d/c & pt & daughter report comfort with this. Pt left in recliner with daughter present at end of session.  BP checked in LUE: Standing after toileting: 125/74 mmHg MAP 89 Sitting in recliner at end of session: 144/84 mmHg MAP 104    Recommendations for follow up therapy are one component of a multi-disciplinary discharge planning process, led by the attending physician.  Recommendations may be updated based on patient status, additional functional criteria and insurance authorization.  Follow Up Recommendations  Home health PT      Assistance Recommended at Discharge Frequent or constant Supervision/Assistance  Patient can return home with the following Assistance with cooking/housework;Help with stairs or ramp for entrance;Assist for transportation;A little help with walking and/or transfers;A little help with bathing/dressing/bathroom   Equipment Recommendations  Rolling walker (2 wheels)    Recommendations for Other Services       Precautions / Restrictions Precautions Precautions: Fall;Back Restrictions Weight Bearing Restrictions: No     Mobility  Bed Mobility Overal bed mobility: Needs Assistance Bed Mobility: Rolling, Sidelying to Sit Rolling: Supervision Sidelying to sit: Supervision, HOB elevated       General bed mobility comments: use of bed rails PRN    Transfers Overall transfer level: Needs assistance Equipment used: Rolling walker (2 wheels) Transfers: Sit to/from Stand Sit to Stand: Supervision (STS from EOB, recliner & toilet in bathroom)                Ambulation/Gait Ambulation/Gait assistance: Min assist, Min guard Gait Distance (Feet):  (10 ft + 10 ft + 125 ft) Assistive device: Rolling walker (2 wheels) Gait Pattern/deviations: Decreased step length - right, Decreased stride length, Decreased step length - left Gait velocity: decreased     General Gait Details: Pt attempts to ambulate without AD bed<>bathroom but pt reaching for objects/furniture the entire time. PT educates her on this being unsafe & recommendations to use RW at d/c. Pt ambulates with RW & CGA with slightly improving balance room>gym (daughter provides chair follow in case pt needs a rest break).   Stairs Stairs: Yes Stairs assistance: Min assist, Min guard Stair Management: Two rails, Step to pattern (lead with L foot to  ascend, RLE to descend) Number of Stairs:  (4+3)     Wheelchair Mobility    Modified Rankin (Stroke Patients Only)       Balance Overall balance assessment: Needs  assistance Sitting-balance support: Feet supported, Bilateral upper extremity supported Sitting balance-Leahy Scale: Good Sitting balance - Comments: supervision sitting EOB   Standing balance support: During functional activity, No upper extremity supported Standing balance-Leahy Scale: Poor Standing balance comment: posterior LOB during static standing without BUE support                            Cognition Arousal/Alertness: Awake/alert Behavior During Therapy: WFL for tasks assessed/performed, Anxious Overall Cognitive Status: Within Functional Limits for tasks assessed                                 General Comments: min cuing to recall log rolling for bed mobility        Exercises      General Comments General comments (skin integrity, edema, etc.): Pt with continent void at beginning of session, performing peri hygiene without assistance.      Pertinent Vitals/Pain Pain Assessment Pain Assessment: Faces Faces Pain Scale: Hurts a little bit Pain Location: generalized pain Pain Descriptors / Indicators: Discomfort Pain Intervention(s): Monitored during session    Home Living                          Prior Function            PT Goals (current goals can now be found in the care plan section) Acute Rehab PT Goals Patient Stated Goal: get better PT Goal Formulation: With patient/family Time For Goal Achievement: 03/25/22 Potential to Achieve Goals: Good Progress towards PT goals: Progressing toward goals    Frequency    7X/week      PT Plan Current plan remains appropriate    Co-evaluation              AM-PAC PT "6 Clicks" Mobility   Outcome Measure  Help needed turning from your back to your side while in a flat bed without using bedrails?: None Help needed moving from lying on your back to sitting on the side of a flat bed without using bedrails?: None Help needed moving to and from a bed to a chair  (including a wheelchair)?: A Little Help needed standing up from a chair using your arms (e.g., wheelchair or bedside chair)?: A Little Help needed to walk in hospital room?: A Little Help needed climbing 3-5 steps with a railing? : A Little 6 Click Score: 20    End of Session Equipment Utilized During Treatment: Gait belt Activity Tolerance: Patient tolerated treatment well (limited by feeling dizzy) Patient left: in chair;with call bell/phone within reach;with family/visitor present   PT Visit Diagnosis: Unsteadiness on feet (R26.81);Muscle weakness (generalized) (M62.81);Difficulty in walking, not elsewhere classified (R26.2);Other abnormalities of gait and mobility (R26.89)     Time: 4166-0630 PT Time Calculation (min) (ACUTE ONLY): 27 min  Charges:  $Therapeutic Activity: 23-37 mins                     Aleda Grana, PT, DPT 03/13/22, 2:56 PM   Sandi Mariscal 03/13/2022, 2:53 PM

## 2022-03-13 NOTE — Progress Notes (Addendum)
Physical Therapy Treatment Patient Details Name: Tammy Stephens MRN: [id number] DOB: 09-Mar-1956 Today's Date: 03/13/2022   History of Present Illness Pt is a 66 y/o F admitted on 03/10/22. Pt has been diagnosed with chronic pain syndrome & had successful trial for spinal cord stimulator so underwent placement of permanent device by Dr. Myer Haff on 03/10/22. Postop pt remained mechanically intubated due to encephalopathy and developed hypotension requiring vasopressors. PMH: arthritis, cataracts, depression, GERD, kidney stones, HLD, HTN, stroke    PT Comments    Pt seen for PT tx with pt's daughter present for session. Pt is able to complete bed mobility & STS without physical assistance, demonstrates poor static standing without BUE support. Pt ambulates increased distance but still requires RW, min assist & demonstrates impaired balance & strength. Pt continues to report feeling "woozy" that worsens with mobility so stair training deferred at this time. Team made aware of ongoing issues & new c/o pain.  BP checked in LUE: Sitting EOB: 144/79 mmHg MAP 102 Standing at 0: 118/68 mmHg MAP 83 Standing at 3: 137/108 mmHg MAP 116 Sitting EOB after gait: 148/55 mmHg MAP 87    Recommendations for follow up therapy are one component of a multi-disciplinary discharge planning process, led by the attending physician.  Recommendations may be updated based on patient status, additional functional criteria and insurance authorization.  Follow Up Recommendations  Home health PT     Assistance Recommended at Discharge Frequent or constant Supervision/Assistance  Patient can return home with the following Assistance with cooking/housework;Help with stairs or ramp for entrance;Assist for transportation;A little help with walking and/or transfers;A little help with bathing/dressing/bathroom   Equipment Recommendations  Rolling walker (2 wheels)    Recommendations for Other Services       Precautions  / Restrictions Precautions Precautions: Fall;Back Restrictions Weight Bearing Restrictions: No     Mobility  Bed Mobility Overal bed mobility: Needs Assistance Bed Mobility: Rolling, Sidelying to Sit Rolling: Supervision Sidelying to sit: Supervision, HOB elevated       General bed mobility comments: use of bed rails PRN    Transfers Overall transfer level: Needs assistance Equipment used: Rolling walker (2 wheels) Transfers: Sit to/from Stand Sit to Stand: Supervision                Ambulation/Gait Ambulation/Gait assistance: Min assist Gait Distance (Feet): 100 Feet Assistive device: Rolling walker (2 wheels) Gait Pattern/deviations: Decreased weight shift to left, Decreased step length - right, Decreased step length - left, Decreased dorsiflexion - right, Decreased dorsiflexion - left, Decreased stride length Gait velocity: decreased     General Gait Details: slight lean to R, unsteady, lateral instability   Stairs             Wheelchair Mobility    Modified Rankin (Stroke Patients Only)       Balance Overall balance assessment: Needs assistance Sitting-balance support: Feet supported, Bilateral upper extremity supported Sitting balance-Leahy Scale: Good Sitting balance - Comments: supervision sitting EOB   Standing balance support: During functional activity, No upper extremity supported Standing balance-Leahy Scale: Poor Standing balance comment: posterior LOB during static standing without BUE support                            Cognition Arousal/Alertness: Awake/alert Behavior During Therapy: WFL for tasks assessed/performed, Anxious (tearful re: situation) Overall Cognitive Status: Within Functional Limits for tasks assessed  General Comments: min cuing to recall log rolling for bed mobility        Exercises      General Comments        Pertinent Vitals/Pain Pain  Assessment Pain Assessment: Faces Faces Pain Scale: Hurts even more Pain Location: "all over", back, RLE sciatic pain, HA ("like a migraine") Pain Descriptors / Indicators: Discomfort Pain Intervention(s): Monitored during session (notifed MDs)    Home Living                          Prior Function            PT Goals (current goals can now be found in the care plan section) Acute Rehab PT Goals Patient Stated Goal: get better PT Goal Formulation: With patient/family Time For Goal Achievement: 03/25/22 Potential to Achieve Goals: Good Progress towards PT goals: Progressing toward goals    Frequency    7X/week      PT Plan Current plan remains appropriate    Co-evaluation              AM-PAC PT "6 Clicks" Mobility   Outcome Measure  Help needed turning from your back to your side while in a flat bed without using bedrails?: None Help needed moving from lying on your back to sitting on the side of a flat bed without using bedrails?: None Help needed moving to and from a bed to a chair (including a wheelchair)?: A Little Help needed standing up from a chair using your arms (e.g., wheelchair or bedside chair)?: A Little Help needed to walk in hospital room?: A Little Help needed climbing 3-5 steps with a railing? : A Little 6 Click Score: 20    End of Session Equipment Utilized During Treatment: Gait belt Activity Tolerance:  (limited by feeling woozy) Patient left: with call bell/phone within reach (sitting EOB with daughter & MD in room) Nurse Communication:  (notified MD of pt's c/o & feeling woozy during session) PT Visit Diagnosis: Unsteadiness on feet (R26.81);Muscle weakness (generalized) (M62.81);Difficulty in walking, not elsewhere classified (R26.2);Other abnormalities of gait and mobility (R26.89);Pain Pain - Right/Left:  ("all over", back, RLE)     Time: 2633-3545 PT Time Calculation (min) (ACUTE ONLY): 19 min  Charges:  $Therapeutic  Activity: 8-22 mins                     Aleda Grana, PT, DPT 03/13/22, 9:06 AM  Sandi Mariscal 03/13/2022, 9:00 AM

## 2022-03-14 DIAGNOSIS — G894 Chronic pain syndrome: Secondary | ICD-10-CM | POA: Diagnosis not present

## 2022-03-14 DIAGNOSIS — I951 Orthostatic hypotension: Secondary | ICD-10-CM | POA: Diagnosis not present

## 2022-03-14 DIAGNOSIS — Z9689 Presence of other specified functional implants: Secondary | ICD-10-CM | POA: Diagnosis not present

## 2022-03-14 LAB — BASIC METABOLIC PANEL
Anion gap: 7 (ref 5–15)
BUN: 13 mg/dL (ref 8–23)
CO2: 31 mmol/L (ref 22–32)
Calcium: 8.5 mg/dL — ABNORMAL LOW (ref 8.9–10.3)
Chloride: 104 mmol/L (ref 98–111)
Creatinine, Ser: 0.88 mg/dL (ref 0.44–1.00)
GFR, Estimated: >60 mL/min (ref >60)
Glucose, Bld: 93 mg/dL (ref 70–99)
Potassium: 3.9 mmol/L (ref 3.5–5.1)
Sodium: 142 mmol/L (ref 135–145)

## 2022-03-14 LAB — CBC
HCT: 35.3 % — ABNORMAL LOW (ref 36.0–46.0)
Hemoglobin: 10.9 g/dL — ABNORMAL LOW (ref 12.0–15.0)
MCH: 29 pg (ref 26.0–34.0)
MCHC: 30.9 g/dL (ref 30.0–36.0)
MCV: 93.9 fL (ref 80.0–100.0)
Platelets: 227 10*3/uL (ref 150–400)
RBC: 3.76 MIL/uL — ABNORMAL LOW (ref 3.87–5.11)
RDW: 13.5 % (ref 11.5–15.5)
WBC: 9.5 10*3/uL (ref 4.0–10.5)
nRBC: 0 % (ref 0.0–0.2)

## 2022-03-14 MED ORDER — MIDODRINE HCL 10 MG PO TABS: 10.0000 mg | ORAL_TABLET | Freq: Three times a day (TID) | ORAL | 0 refills | Status: AC

## 2022-03-14 NOTE — TOC Transition Note (Signed)
Transition of Care [facility]) - CM/SW Discharge Note   Patient Details  Name: Myrth Dahan MRN: [id number] Date of Birth: 23-Oct-1955  [facility] of [facility]) CM/SW Contact:  Gildardo Griffes, LCSW Phone Number: 03/14/2022, 9:15 AM   Clinical Narrative:     Patient to dc home today, RW was delivered by [employer] at bedside, patient is set up with [facility] PT through [facility] with [facility], informed of dc today.   No further dc needs identified.     Final next level of care: Home w Home Health Services Barriers to Discharge: No Barriers Identified   Patient Goals and CMS Choice Patient states their goals for this hospitalization and ongoing recovery are:: to go home CMS Medicare.gov Compare Post Acute Care list provided to:: Patient Choice offered to / list presented to : Patient  Discharge Placement                    Patient and family notified of of transfer: 03/14/22  Discharge Plan and Services                DME Arranged: Dan Humphreys rolling DME Agency: [employer] Date DME Agency Contacted: 03/14/22   Representative spoke with at DME Agency: Elease Hashimoto HH Arranged: PT HH Agency: [facility] Date [facility] Agency Contacted: 03/14/22 Time HH Agency Contacted: [id number] Representative spoke with at [facility] Agency: Campbell Lerner  Social Determinants of Health (SDOH) Interventions     Readmission Risk Interventions    03/12/2022   10:16 AM  Readmission Risk Prevention Plan  Post Dischage Appt Complete  Medication Screening Complete  Transportation Screening Complete

## 2022-03-14 NOTE — Discharge Summary (Signed)
Physician Discharge Summary  Tammy Stephens CLE:[id number] DOB: 1956/05/25 DOA: 03/10/2022  PCP: Alm Bustard, NP  Admit date: 03/10/2022 Discharge date: 03/14/2022  Admitted From: home  Disposition:  home w/ home health   Recommendations for Outpatient Follow-up:  Follow up with PCP in 1-2 weeks F/u w/ neuro surg, Dr. Myer Haff, as per his recs   Home Health: yes Equipment/Devices:  Discharge Condition: stable  CODE STATUS: full  Diet recommendation:  Regular   Brief/Interim Summary: HPI was taken from NP Delton See: This is a 66 yo female with a hx of chronic pain syndrome and complication of anesthesia (difficulty waking up).  She presented to [facility] on 06/16 for elective placement of a spinal cord stimulator per neurosurgery.  Postop pt remained mechanically intubated due to encephalopathy and developed hypotension requiring vasopressors.  PCCM team contacted to assist with management.    06/16: Pt admitted to ICU post elective spinal stimulator placement for chronic pain syndrome remained mechanically intubated postop due to acute encephalopathy and also developed hypotension postop   As per Dr. Mayford Knife 6/17-6/20/23: Pt had already been extubated by the time I started seeing the pt. Pt was saturating in 90s on RA. Of note, pt was found to have orthostatic hypotension & initially put on IVFs. Midodrine was then added and pt was d/c home w/ short course of midodrine. Pt will f/u w/ her PCP in regards the orthostatic hypotension. For more information, please see previous progress/consult notes  Discharge Diagnoses:  Principal Problem:   Spinal cord stimulator status Active Problems:   Pre-operative laboratory examination   Orthostatic hypotension  Acute hypoxic respiratory failure: intubation, ventilated & extubated. Pt had difficulty waking up after surg on 03/10/22. Currently on RA. Resolved    Hypotension: likely secondary to sedating medications & orthostatic hypotension.  Required vasopressors but since has been weaned off.  Continue on midodrine    Acute encephalopathy: resolved      Chronic pain syndrome: s/p spinal cord stimulator placement on 03/10/22 as per neuro surg. PO pain meds only as per neuro surg    Hypokalemia: WNL   Leukocytosis: resolved    Transaminitis: resolved   Discharge Instructions  Discharge Instructions     Diet - low sodium heart healthy   Complete by: As directed    Discharge instructions   Complete by: As directed    F/u w/ PCP in 1-2 weeks. F/u w/ neuro surg, Dr. Myer Haff, in 1-2 weeks   Discharge instructions   Complete by: As directed    F/u w/ PCP in 1-2 weeks. F/u w/ neuro surg, Dr. Myer Haff, as he previously told you   Incentive spirometry RT   Complete by: As directed    Increase activity slowly   Complete by: As directed    Increase activity slowly   Complete by: As directed    No wound care   Complete by: As directed    No wound care   Complete by: As directed       Allergies as of 03/14/2022       Reactions   Fluoxetine    Other reaction(s): Hallucination, Hallucinations-Intolerance        Medication List     STOP taking these medications    b complex vitamins capsule   cyclobenzaprine 5 MG tablet Commonly known as: FLEXERIL   ketorolac 10 MG tablet Commonly known as: TORADOL   nortriptyline 25 MG capsule Commonly known as: PAMELOR       TAKE these medications  amLODipine 5 MG tablet Commonly known as: NORVASC Take 5 mg by mouth daily.   aspirin EC 81 MG tablet Take 81 mg by mouth daily. Swallow whole.   atorvastatin 20 MG tablet Commonly known as: LIPITOR Take 20 mg by mouth daily.   cholecalciferol 25 MCG (1000 UNIT) tablet Commonly known as: VITAMIN D3 Take 1,000 Units by mouth daily.   DULoxetine 60 MG capsule Commonly known as: CYMBALTA Take 60 mg by mouth daily.   gabapentin 300 MG capsule Commonly known as: NEURONTIN Take 300 mg by mouth 3 (three)  times daily. What changed: Another medication with the same name was removed. Continue taking this medication, and follow the directions you see here.   losartan 50 MG tablet Commonly known as: COZAAR Take 50 mg by mouth daily.   midodrine 10 MG tablet Commonly known as: PROAMATINE Take 1 tablet (10 mg total) by mouth 3 (three) times daily with meals for 14 days.   multivitamin-iron-minerals-folic acid chewable tablet Chew 1 tablet by mouth daily.   omeprazole 20 MG capsule Commonly known as: PRILOSEC Take 20 mg by mouth daily.   Oxycodone HCl 10 MG Tabs Take 1 tablet (10 mg total) by mouth every 6 (six) hours as needed for up to 5 days for severe pain or moderate pain.               Durable Medical Equipment  (From admission, onward)           Start     Ordered   03/11/22 1324  For home use only DME Walker rolling  Once       Question Answer Comment  Walker: With 5 Inch Wheels   Patient needs a walker to treat with the following condition General weakness      03/11/22 1324            Follow-up Information     Susanne Borders, PA Follow up in 2 week(s).   Why: for post-op and incision check. This appointment date and time is listed on your pre-op paperwork. Please call the office with any questions regarding appointment date and time. Please bring spinal cord stimulator equipment to this visit for the rep to make adjustments Contact information: [address] [place] [address] [phone]                Allergies  Allergen Reactions   Fluoxetine     Other reaction(s): Hallucination, Hallucinations-Intolerance    Consultations: ICU Neuro surg   Procedures/Studies: DG Chest Port 1 View  Result Date: 03/11/2022 CLINICAL DATA:  66 year old female with a history intubation EXAM: PORTABLE CHEST 1 VIEW COMPARISON:  None Available. FINDINGS: Cardiomediastinal silhouette borderline enlarged. Fullness in the central vasculature,  potentially secondary to low lung volumes. Asymmetric elevation the right hemidiaphragm. No pneumothorax or pleural effusion. No confluent airspace disease. Endotracheal tube terminates at the clavicular heads. 5.2 cm above the carina. Generator partially visualized overlying the abdomen. IMPRESSION: Endotracheal tube terminates at the level of the clavicular heads, 5.2 cm above the carina. Low lung volumes, with likely atelectasis Electronically Signed   By: Gilmer Mor D.O.   On: 03/11/2022 09:26   DG Thoracic Spine 2 View  Result Date: 03/10/2022 CLINICAL DATA:  Peri op. EXAM: THORACIC SPINE 2 VIEWS COMPARISON:  None Available. FINDINGS: Fluoro time: 9 seconds. Reported cumulative air kerma: 2.7 mGy. Five C-arm fluoroscopic images were obtained intraoperatively and submitted for post operative interpretation. These images demonstrate placement of a  thoracic spinal cord stimulator with leads projecting midline. Please see the performing provider's procedural report for further detail. IMPRESSION: Intraoperative fluoroscopy, described above. Electronically Signed   By: Feliberto Harts M.D.   On: 03/10/2022 09:13   DG C-Arm 1-60 Min-No Report  Result Date: 03/10/2022 Fluoroscopy was utilized by the requesting physician.  No radiographic interpretation.   DG C-Arm 1-60 Min-No Report  Result Date: 03/10/2022 Fluoroscopy was utilized by the requesting physician.  No radiographic interpretation.   DG PAIN CLINIC C-ARM 1-60 MIN NO REPORT  Result Date: 02/13/2022 Fluoro was used, but no Radiologist interpretation will be provided. Please refer to "NOTES" tab for provider progress note.  (Echo, Carotid, EGD, Colonoscopy, ERCP)    Subjective: Pt denies any complaints    Discharge Exam: Vitals:   03/14/22 0326 03/14/22 0810  BP: 134/75 133/71  Pulse: 93 89  Resp: 15 15  Temp: 98.2 F (36.8 C) 98.7 F (37.1 C)  SpO2: 96% 97%   Vitals:   03/13/22 1756 03/13/22 1918 03/14/22 0326 03/14/22  0810  BP: 130/64 132/73 134/75 133/71  Pulse: 96 89 93 89  Resp: 14 16 15 15   Temp: 98.9 F (37.2 C) 98.8 F (37.1 C) 98.2 F (36.8 C) 98.7 F (37.1 C)  TempSrc:    Oral  SpO2: 96% 94% 96% 97%  Weight:      Height:        General: Pt is alert, awake, not in acute distress Cardiovascular: S1/S2 +, no rubs, no gallops Respiratory: CTA bilaterally, no wheezing, no rhonchi Abdominal: Soft, NT, obese, bowel sounds + Extremities:  no cyanosis    The results of significant diagnostics from this hospitalization (including imaging, microbiology, ancillary and laboratory) are listed below for reference.     Microbiology: Recent Results (from the past 240 hour(s))  MRSA Next Gen by PCR, Nasal     Status: None   Collection Time: 03/10/22  9:56 AM   Specimen: Nasal Mucosa; Nasal Swab  Result Value Ref Range Status   MRSA by PCR Next Gen NOT DETECTED NOT DETECTED Final    Comment: (NOTE) The GeneXpert MRSA Assay (FDA approved for NASAL specimens only), is one component of a comprehensive MRSA colonization surveillance program. It is not intended to diagnose MRSA infection nor to guide or monitor treatment for MRSA infections. Test performance is not FDA approved in patients less than 62 years old. Performed at [facility], [address] [place], [place]      Labs: BNP (last 3 results) No results for input(s): "BNP" in the last 8760 hours. Basic Metabolic Panel: Recent Labs  Lab 03/10/22 1142 03/11/22 0850 03/12/22 0452 03/13/22 0608 03/14/22 0308  NA 139 136 140 141 142  K 3.3* 4.5 5.0 4.0 3.9  CL 106 103 105 106 104  CO2 27 27 30 31 31   GLUCOSE 163* 144* 129* 91 93  BUN 16 11 14 12 13   CREATININE 0.72 0.80 0.75 0.99 0.88  CALCIUM 8.3* 8.7* 8.8* 8.5* 8.5*  MG 2.1  --   --   --   --   PHOS 3.6  --   --   --   --    Liver Function Tests: Recent Labs  Lab 03/10/22 1142 03/12/22 0452 03/13/22 0608  AST 110* 29 22  ALT 81* 53* 43  ALKPHOS  92 81 75  BILITOT 0.6 0.5 0.4  PROT 6.0* 6.1* 5.2*  ALBUMIN 3.3* 3.4* 2.8*   No results for input(s): "LIPASE", "  AMYLASE" in the last 168 hours. No results for input(s): "AMMONIA" in the last 168 hours. CBC: Recent Labs  Lab 03/10/22 1028 03/11/22 0850 03/12/22 0452 03/13/22 0608 03/14/22 0308  WBC 14.0* 19.8* 14.8* 8.1 9.5  NEUTROABS 11.8*  --   --   --   --   HGB 11.9* 11.1* 11.0* 10.4* 10.9*  HCT 38.2 36.2 36.1 33.8* 35.3*  MCV 94.1 93.1 94.3 96.6 93.9  PLT 264 225 220 198 227   Cardiac Enzymes: No results for input(s): "CKTOTAL", "CKMB", "CKMBINDEX", "TROPONINI" in the last 168 hours. BNP: Invalid input(s): "POCBNP" CBG: Recent Labs  Lab 03/10/22 0955  GLUCAP 132*   D-Dimer No results for input(s): "DDIMER" in the last 72 hours. Hgb A1c Recent Labs    03/12/22 0452  HGBA1C 5.9*   Lipid Profile No results for input(s): "CHOL", "HDL", "LDLCALC", "TRIG", "CHOLHDL", "LDLDIRECT" in the last 72 hours. Thyroid function studies No results for input(s): "TSH", "T4TOTAL", "T3FREE", "THYROIDAB" in the last 72 hours.  Invalid input(s): "FREET3" Anemia work up No results for input(s): "VITAMINB12", "FOLATE", "FERRITIN", "TIBC", "IRON", "RETICCTPCT" in the last 72 hours. Urinalysis    Component Value Date/Time   COLORURINE YELLOW (A) 03/03/2022 1330   APPEARANCEUR HAZY (A) 03/03/2022 1330   LABSPEC 1.017 03/03/2022 1330   PHURINE 5.0 03/03/2022 1330   GLUCOSEU NEGATIVE 03/03/2022 1330   HGBUR NEGATIVE 03/03/2022 1330   BILIRUBINUR NEGATIVE 03/03/2022 1330   KETONESUR 5 (A) 03/03/2022 1330   PROTEINUR NEGATIVE 03/03/2022 1330   NITRITE NEGATIVE 03/03/2022 1330   LEUKOCYTESUR MODERATE (A) 03/03/2022 1330   Sepsis Labs Recent Labs  Lab 03/11/22 0850 03/12/22 0452 03/13/22 0608 03/14/22 0308  WBC 19.8* 14.8* 8.1 9.5   Microbiology Recent Results (from the past 240 hour(s))  MRSA Next Gen by PCR, Nasal     Status: None   Collection Time: 03/10/22  9:56 AM    Specimen: Nasal Mucosa; Nasal Swab  Result Value Ref Range Status   MRSA by PCR Next Gen NOT DETECTED NOT DETECTED Final    Comment: (NOTE) The GeneXpert MRSA Assay (FDA approved for NASAL specimens only), is one component of a comprehensive MRSA colonization surveillance program. It is not intended to diagnose MRSA infection nor to guide or monitor treatment for MRSA infections. Test performance is not FDA approved in patients less than 107 years old. Performed at [facility], [address], [place], [place] [address]      Time coordinating discharge: Over 30 minutes  SIGNED:   Charise Killian, MD  [facility] 03/14/2022, 9:12 AM Pager   If 7PM-7AM, please contact night-coverage

## 2022-03-14 NOTE — Progress Notes (Signed)
    Attending Progress Note  History: Tammy Stephens is here for chronic pain.   POD3: Feeling better. POD1: Events of post-op period noted.  She is doing well now, but has expected post-op pain.  Physical Exam: Vitals:   03/14/22 0326 03/14/22 0810  BP: 134/75 133/71  Pulse: 93 89  Resp: 15 15  Temp: 98.2 F (36.8 C) 98.7 F (37.1 C)  SpO2: 96% 97%    AA Ox3 CNI  Strength:5/5 throughout BLE Stable loss of sensation of LLE  Data:  Recent Labs  Lab 03/12/22 0452 03/13/22 0608 03/14/22 0308  NA 140 141 142  K 5.0 4.0 3.9  CL 105 106 104  CO2 30 31 31   BUN 14 12 13   CREATININE 0.75 0.99 0.88  GLUCOSE 129* 91 93  CALCIUM 8.8* 8.5* 8.5*   Recent Labs  Lab 03/13/22 0608  AST 22  ALT 43  ALKPHOS 75     Recent Labs  Lab 03/12/22 0452 03/13/22 0608 03/14/22 0308  WBC 14.8* 8.1 9.5  HGB 11.0* 10.4* 10.9*  HCT 36.1 33.8* 35.3*  PLT 220 198 227   No results for input(s): "APTT", "INR" in the last 168 hours.       Other tests/results: n/a  Assessment/Plan:  Jewelene Mairena is stable on POD3 s/p stimulator placement  - mobilize - pain control - orals only - DVT prophylaxis - PTOT - Mgmt of orthostasis per medicine   03/16/22 MD, [facility] Department of Neurosurgery

## 2022-03-14 NOTE — Progress Notes (Addendum)
    Attending Progress Note  History: Jet Traynham is here for chronic pain. POD4: Doing well this AM POD3: Feeling better. POD1: Events of post-op period noted.  She is doing well now, but has expected post-op pain.  Physical Exam: Vitals:   03/14/22 0326 03/14/22 0810  BP: 134/75 133/71  Pulse: 93 89  Resp: 15 15  Temp: 98.2 F (36.8 C) 98.7 F (37.1 C)  SpO2: 96% 97%    AA Ox3 CNI  Strength:5/5 throughout BLE Stable loss of sensation of LLE  Data:  Recent Labs  Lab 03/12/22 0452 03/13/22 0608 03/14/22 0308  NA 140 141 142  K 5.0 4.0 3.9  CL 105 106 104  CO2 30 31 31   BUN 14 12 13   CREATININE 0.75 0.99 0.88  GLUCOSE 129* 91 93  CALCIUM 8.8* 8.5* 8.5*   Recent Labs  Lab 03/13/22 0608  AST 22  ALT 43  ALKPHOS 75     Recent Labs  Lab 03/12/22 0452 03/13/22 0608 03/14/22 0308  WBC 14.8* 8.1 9.5  HGB 11.0* 10.4* 10.9*  HCT 36.1 33.8* 35.3*  PLT 220 198 227   No results for input(s): "APTT", "INR" in the last 168 hours.       Other tests/results: n/a  Assessment/Plan:  Raevin Wierenga is stable on POD3 s/p stimulator placement  - mobilize - pain control - orals only - DVT prophylaxis - PTOT   03/16/22 MD, [facility] Department of Neurosurgery

## 2022-03-15 DIAGNOSIS — R8290 Unspecified abnormal findings in urine: Secondary | ICD-10-CM

## 2022-03-15 DIAGNOSIS — M5116 Intervertebral disc disorders with radiculopathy, lumbar region: Secondary | ICD-10-CM

## 2022-03-15 DIAGNOSIS — Z01812 Encounter for preprocedural laboratory examination: Secondary | ICD-10-CM

## 2022-03-22 LAB — BLOOD GAS, ARTERIAL
Acid-base deficit: 1.1 mmol/L (ref 0.0–2.0)
Bicarbonate: 22.6 mmol/L (ref 20.0–28.0)
FIO2: 40 %
MECHVT: 450 mL
Mechanical Rate: 15
O2 Saturation: 97 %
PEEP: 5 cmH2O
Patient temperature: 37
pCO2 arterial: 34 mmHg (ref 32–48)
pH, Arterial: 7.43 (ref 7.35–7.45)
pO2, Arterial: 143 mmHg — ABNORMAL HIGH (ref 83–108)

## 2022-03-31 ENCOUNTER — Encounter: Payer: Self-pay | Admitting: Neurosurgery

## 2022-03-31 ENCOUNTER — Ambulatory Visit (INDEPENDENT_AMBULATORY_CARE_PROVIDER_SITE_OTHER): Payer: Medicare Other | Admitting: Neurosurgery

## 2022-03-31 VITALS — BP 143/100 | HR 101 | Ht 62.0 in

## 2022-03-31 DIAGNOSIS — Z9689 Presence of other specified functional implants: Secondary | ICD-10-CM

## 2022-03-31 NOTE — Progress Notes (Signed)
   REFERRING PHYSICIAN:  Alm Bustard, Np [address] [place],  [place] [address]  DOS: 03/10/22 SCS placement   HISTORY OF PRESENT ILLNESS: Tammy Stephens is approximately 2 weeks status post SCS placement. she is doing well with minimal pain.  She is not currently taking medications for pain.  She did experience a rash on her abdomen postoperatively which was treated by her primary care provider and is since resolved.  She denies any incisional concerns.  PHYSICAL EXAMINATION:  General: Patient is well developed, well nourished, calm, collected, and in no apparent distress.   NEUROLOGICAL:  General: In no acute distress.   Awake, alert, oriented to person, place, and time.  Pupils equal round and reactive to light.  Facial tone is symmetric.  Tongue protrusion is midline.  There is no pronator drift.   Strength:            Side Iliopsoas Quads Hamstring PF DF EHL  R 5 5 5 5 5 5   L 5 5 5 5 5 5    Incisions c/d/I and well healed   ROS (Neurologic):  Negative except as noted above  IMAGING: No interval imaging to review  ASSESSMENT/PLAN:  Tammy Stephens is doing well approximately 2 weeks after placement of spinal cord stimulator.  We discussed activity escalation and I have advised the patient to lift up to 10 pounds until 6 weeks after surgery, then increase up to 25 pounds until 12 weeks after surgery.  After 12 weeks post-op, the patient advised to increase activity as tolerated. she will follow up 4 weeks with Dr. or sooner should she have any questions or concerns.  Tammy Stephens is understanding and is in agreement with this plan.  Marcell Barlow PA-C Department of neurosurgery

## 2022-04-20 ENCOUNTER — Ambulatory Visit: Payer: Medicare Other | Admitting: Neurosurgery

## 2022-04-20 ENCOUNTER — Encounter: Payer: Self-pay | Admitting: Neurosurgery

## 2022-04-20 VITALS — BP 152/92 | HR 107 | Temp 98.2°F | Ht 62.0 in | Wt 206.2 lb

## 2022-04-20 DIAGNOSIS — G894 Chronic pain syndrome: Secondary | ICD-10-CM

## 2022-04-20 NOTE — Progress Notes (Signed)
   DOS: 03/10/22  HISTORY OF PRESENT ILLNESS: 04/20/2022 Ms. Jenavive Lamboy is status post spinal cord stimulator placement.  She is doing very well.  She recently moved to [place].  PHYSICAL EXAMINATION:   Vitals:   04/20/22 1537  BP: (!) 152/92  Pulse: (!) 107  Temp: 98.2 F (36.8 C)   General: Patient is well developed, well nourished, calm, collected, and in no apparent distress.  NEUROLOGICAL:  General: In no acute distress.  Awake, alert, oriented to person, place, and time. Pupils equal round and reactive to light.   Strength:  Side Iliopsoas Quads Hamstring PF DF EHL  R 5 5 5 5 5 5   L 5 5 5 5 5 5    Incision c/d/i   ROS (Neurologic): Negative except as noted above  IMAGING: No interval imaging to review   ASSESSMENT/PLAN:  Tayten Heber is doing well after spinal cord stimulator placement.  She will reestablish care in with the rep there.  I will be happy to see her in the future if needed.  I spent a total of 10 minutes in face-to-face and non-face-to-face activities related to this patient's care today.   [name] MD, [facility] Department of Neurosurgery

## 2022-05-02 ENCOUNTER — Encounter: Payer: Medicare Other | Admitting: Neurosurgery

## 2022-05-12 ENCOUNTER — Encounter: Payer: Self-pay | Admitting: Neurosurgery
